# Patient Record
Sex: Female | Born: 1990 | Race: White | Hispanic: No | Marital: Single | Smoking: Never smoker
Health system: Southern US, Community
[De-identification: ages and names within clinical notes are randomized; demographics above are authoritative.]

## PROBLEM LIST (undated history)

## (undated) DIAGNOSIS — E162 Hypoglycemia, unspecified: Secondary | ICD-10-CM

## (undated) HISTORY — DX: Hypoglycemia, unspecified: E16.2

---
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
## 2013-08-20 ENCOUNTER — Telehealth: Payer: Self-pay | Admitting: Family Medicine

## 2013-08-20 NOTE — Telephone Encounter (Signed)
Patient called and wanted to schedule an appointment to have her shots done. Patient states that she needs the flu shot/ tb/ mmr/ chicken pox /tetanus/hep B for her employment. Please advise.

## 2013-08-20 NOTE — Telephone Encounter (Signed)
Patient has not been seen in the office since 2012. Pt needs OV.

## 2013-08-21 ENCOUNTER — Encounter (INDEPENDENT_AMBULATORY_CARE_PROVIDER_SITE_OTHER): Payer: Self-pay

## 2013-08-21 ENCOUNTER — Ambulatory Visit (INDEPENDENT_AMBULATORY_CARE_PROVIDER_SITE_OTHER): Payer: 59 | Admitting: Family Medicine

## 2013-08-21 ENCOUNTER — Encounter: Payer: Self-pay | Admitting: Family Medicine

## 2013-08-21 VITALS — BP 120/78 | HR 94 | Temp 98.4°F | Resp 16 | Ht 64.5 in | Wt 223.5 lb

## 2013-08-21 DIAGNOSIS — Z0184 Encounter for antibody response examination: Secondary | ICD-10-CM

## 2013-08-21 DIAGNOSIS — Z23 Encounter for immunization: Secondary | ICD-10-CM

## 2013-08-21 DIAGNOSIS — Z0000 Encounter for general adult medical examination without abnormal findings: Secondary | ICD-10-CM

## 2013-08-21 DIAGNOSIS — Z111 Encounter for screening for respiratory tuberculosis: Secondary | ICD-10-CM

## 2013-08-21 NOTE — Patient Instructions (Signed)
Come back on Friday to have your TB test read Follow up in 1 year or as needed We'll notify you of your lab results and make any changes if needed Call with any questions or concerns GOOD LUCK!

## 2013-08-21 NOTE — Progress Notes (Signed)
Pre visit review using our clinic review tool, if applicable. No additional management support is needed unless otherwise documented below in the visit note. 

## 2013-08-21 NOTE — Assessment & Plan Note (Signed)
Pt's PE WNL w/ exception of obesity.  Check labs.  Anticipatory guidance provided.  

## 2013-08-21 NOTE — Telephone Encounter (Signed)
Patient's mother called and stated that patient received immunizations until the age of 89. The office where she received these immunizations misplaced her records. Appointment is scheduled for 08/21/2013. JG//CMA

## 2013-08-21 NOTE — Progress Notes (Signed)
   Subjective:    Patient ID: Meredith [name], female    DOB: 04-Jan-1991, 23 y.o.   MRN: [id number]  HPI CPE- no concerns today.   Review of Systems Patient reports no vision/ hearing changes, adenopathy,fever, weight change,  persistant/recurrent hoarseness , swallowing issues, chest pain, palpitations, edema, persistant/recurrent cough, hemoptysis, dyspnea (rest/exertional/paroxysmal nocturnal), gastrointestinal bleeding (melena, rectal bleeding), abdominal pain, significant heartburn, bowel changes, GU symptoms (dysuria, hematuria, incontinence), Gyn symptoms (abnormal  bleeding, pain),  syncope, focal weakness, memory loss, numbness & tingling, skin/hair/nail changes, abnormal bruising or bleeding, anxiety, or depression.     Objective:   Physical Exam General Appearance:    Alert, cooperative, no distress, appears stated age  Head:    Normocephalic, without obvious abnormality, atraumatic  Eyes:    PERRL, conjunctiva/corneas clear, EOM's intact, fundi    benign, both eyes  Ears:    Normal TM's and external ear canals, both ears  Nose:   Nares normal, septum midline, mucosa normal, no drainage    or sinus tenderness  Throat:   Lips, mucosa, and tongue normal; teeth and gums normal  Neck:   Supple, symmetrical, trachea midline, no adenopathy;    Thyroid: no enlargement/tenderness/nodules  Back:     Symmetric, no curvature, ROM normal, no CVA tenderness  Lungs:     Clear to auscultation bilaterally, respirations unlabored  Chest Wall:    No tenderness or deformity   Heart:    Regular rate and rhythm, S1 and S2 normal, no murmur, rub   or gallop  Breast Exam:    Deferred at pt's request  Abdomen:     Soft, non-tender, bowel sounds active all four quadrants,    no masses, no organomegaly  Genitalia:    Deferred  Rectal:    Extremities:   Extremities normal, atraumatic, no cyanosis or edema  Pulses:   2+ and symmetric all extremities  Skin:   Skin color, texture, turgor normal, no  rashes or lesions  Lymph nodes:   Cervical, supraclavicular, and axillary nodes normal  Neurologic:   CNII-XII intact, normal strength, sensation and reflexes    throughout          Assessment & Plan:

## 2013-08-22 ENCOUNTER — Encounter: Payer: Self-pay | Admitting: General Practice

## 2013-08-22 LAB — CBC WITH DIFFERENTIAL/PLATELET
BASOS ABS: 0 10*3/uL (ref 0.0–0.1)
Basophils Relative: 0.3 % (ref 0.0–3.0)
Eosinophils Absolute: 0.4 10*3/uL (ref 0.0–0.7)
Eosinophils Relative: 3.9 % (ref 0.0–5.0)
HEMATOCRIT: 41.1 % (ref 36.0–46.0)
Hemoglobin: 13.8 g/dL (ref 12.0–15.0)
LYMPHS PCT: 34.2 % (ref 12.0–46.0)
Lymphs Abs: 3.3 10*3/uL (ref 0.7–4.0)
MCHC: 33.6 g/dL (ref 30.0–36.0)
MCV: 88.5 fl (ref 78.0–100.0)
MONOS PCT: 4.4 % (ref 3.0–12.0)
Monocytes Absolute: 0.4 10*3/uL (ref 0.1–1.0)
Neutro Abs: 5.5 10*3/uL (ref 1.4–7.7)
Neutrophils Relative %: 57.2 % (ref 43.0–77.0)
PLATELETS: 234 10*3/uL (ref 150.0–400.0)
RBC: 4.65 Mil/uL (ref 3.87–5.11)
RDW: 14 % (ref 11.5–14.6)
WBC: 9.6 10*3/uL (ref 4.5–10.5)

## 2013-08-22 LAB — HEPATIC FUNCTION PANEL
ALBUMIN: 4.4 g/dL (ref 3.5–5.2)
ALT: 19 U/L (ref 0–35)
AST: 18 U/L (ref 0–37)
Alkaline Phosphatase: 43 U/L (ref 39–117)
Bilirubin, Direct: 0 mg/dL (ref 0.0–0.3)
TOTAL PROTEIN: 7.8 g/dL (ref 6.0–8.3)
Total Bilirubin: 0.5 mg/dL (ref 0.3–1.2)

## 2013-08-22 LAB — BASIC METABOLIC PANEL
BUN: 14 mg/dL (ref 6–23)
CO2: 24 meq/L (ref 19–32)
Calcium: 9 mg/dL (ref 8.4–10.5)
Chloride: 106 mEq/L (ref 96–112)
Creatinine, Ser: 0.5 mg/dL (ref 0.4–1.2)
GFR: 180.43 mL/min (ref >60.00)
GLUCOSE: 74 mg/dL (ref 70–99)
POTASSIUM: 3.7 meq/L (ref 3.5–5.1)
SODIUM: 140 meq/L (ref 135–145)

## 2013-08-22 LAB — LIPID PANEL
Cholesterol: 195 mg/dL (ref 0–200)
HDL: 45.8 mg/dL (ref >39.00)
LDL Cholesterol: 128 mg/dL — ABNORMAL HIGH (ref 0–99)
Total CHOL/HDL Ratio: 4
Triglycerides: 104 mg/dL (ref 0.0–149.0)
VLDL: 20.8 mg/dL (ref 0.0–40.0)

## 2013-08-22 LAB — TSH: TSH: 0.68 u[IU]/mL (ref 0.35–5.50)

## 2013-08-22 LAB — VARICELLA ZOSTER ANTIBODY, IGG: VARICELLA IGG: 771.9 {index} — AB (ref <135.00)

## 2013-08-23 LAB — TB SKIN TEST
Induration: 0 mm
TB SKIN TEST: NEGATIVE

## 2013-08-26 ENCOUNTER — Encounter: Payer: Self-pay | Admitting: General Practice

## 2013-08-26 LAB — VITAMIN D 1,25 DIHYDROXY
VITAMIN D 1, 25 (OH) TOTAL: 96 pg/mL — AB (ref 18–72)
VITAMIN D3 1, 25 (OH): 96 pg/mL

## 2014-03-21 ENCOUNTER — Ambulatory Visit (INDEPENDENT_AMBULATORY_CARE_PROVIDER_SITE_OTHER): Payer: 59

## 2014-03-21 DIAGNOSIS — Z23 Encounter for immunization: Secondary | ICD-10-CM

## 2014-05-14 ENCOUNTER — Ambulatory Visit: Payer: 59 | Admitting: Physician Assistant

## 2015-03-16 ENCOUNTER — Telehealth: Payer: Self-pay | Admitting: Family Medicine

## 2015-03-16 NOTE — Telephone Encounter (Signed)
Relation to ZO:XWRU Call back [phone]   Reason for call:  Patient would like her TB done at the same time as her flu shot 03/17/15

## 2015-03-17 ENCOUNTER — Ambulatory Visit (INDEPENDENT_AMBULATORY_CARE_PROVIDER_SITE_OTHER): Payer: 59

## 2015-03-17 DIAGNOSIS — Z23 Encounter for immunization: Secondary | ICD-10-CM

## 2015-03-17 NOTE — Telephone Encounter (Signed)
Ok to have ppd

## 2015-03-17 NOTE — Addendum Note (Signed)
Addended by: Lurline Hare on: 03/17/2015 03:28 PM   Modules accepted: Orders

## 2015-03-19 LAB — TB SKIN TEST
Induration: 0 mm
TB Skin Test: NEGATIVE

## 2015-11-02 ENCOUNTER — Ambulatory Visit (INDEPENDENT_AMBULATORY_CARE_PROVIDER_SITE_OTHER): Payer: 59 | Admitting: Physician Assistant

## 2015-11-02 ENCOUNTER — Encounter: Payer: Self-pay | Admitting: Physician Assistant

## 2015-11-02 VITALS — BP 120/78 | HR 93 | Temp 98.2°F | Ht 64.5 in | Wt 262.8 lb

## 2015-11-02 DIAGNOSIS — J029 Acute pharyngitis, unspecified: Secondary | ICD-10-CM | POA: Diagnosis not present

## 2015-11-02 DIAGNOSIS — H6692 Otitis media, unspecified, left ear: Secondary | ICD-10-CM

## 2015-11-02 LAB — POCT RAPID STREP A (OFFICE): RAPID STREP A SCREEN: NEGATIVE

## 2015-11-02 MED ORDER — AZITHROMYCIN 250 MG PO TABS: ORAL_TABLET | ORAL | Status: DC

## 2015-11-02 NOTE — Progress Notes (Signed)
Pre visit review using our clinic review tool, if applicable. No additional management support is needed unless otherwise documented below in the visit note. 

## 2015-11-02 NOTE — Progress Notes (Signed)
   Patient presents to clinic today c/o sore throat starting on Thursday of last week. Endorses nasal congestion, sinus pressure and sinus pain. Endorses dry cough starting today. Endorses intermittent fevers but none today. Denies recent travel or sick contact. Has taken Nyquil and Ibuprofen for symptom relief.   Past Medical History  Diagnosis Date  . Hypoglycemia     No current outpatient prescriptions on file prior to visit.   No current facility-administered medications on file prior to visit.    Allergies  Allergen Reactions  . Penicillins     Family History  Problem Relation Age of Onset  . Hyperlipidemia Father     Social History   Social History  . Marital Status: Married    Spouse Name: N/A  . Number of Children: N/A  . Years of Education: N/A   Social History Main Topics  . Smoking status: Never Smoker   . Smokeless tobacco: None  . Alcohol Use: No  . Drug Use: No  . Sexual Activity: Not Asked   Other Topics Concern  . None   Social History Narrative   Review of Systems - See HPI.  All other ROS are negative.  BP 120/78 mmHg  Pulse 93  Temp(Src) 98.2 F (36.8 C) (Oral)  Ht 5' 4.5" (1.638 m)  Wt 262 lb 12.8 oz (119.205 kg)  BMI 44.43 kg/m2  SpO2 99%  LMP 10/23/2015  Physical Exam  Constitutional: She is oriented to person, place, and time and well-developed, well-nourished, and in no distress.  HENT:  Head: Normocephalic and atraumatic.  Right Ear: Tympanic membrane normal.  Left Ear: Tympanic membrane is erythematous and bulging. A middle ear effusion is present.  Nose: Nose normal.  Mouth/Throat: Uvula is midline and mucous membranes are normal.  Tonsils 2+. No exudate present.   Eyes: Conjunctivae are normal.  Neck: Neck supple.  Cardiovascular: Normal rate, regular rhythm, normal heart sounds and intact distal pulses.   Pulmonary/Chest: Effort normal and breath sounds normal. No respiratory distress. She has no wheezes. She has no  rales. She exhibits no tenderness.  Neurological: She is alert and oriented to person, place, and time.  Skin: Skin is warm and dry. No rash noted.  Psychiatric: Affect normal.   Assessment/Plan: 1. Sore throat Rapid strep negative. Exam without signs of strep throat. Feel symptoms stemming from PND and related to developing sinusitis. Will begin ABX today. Supportive measures reviewed. - POCT rapid strep A  2. Acute left otitis media, recurrence not specified, unspecified otitis media type Penicillin-allergic. Will begin Z-pack. Supportive measures reviewed. FU PRN. - azithromycin (ZITHROMAX) 250 MG tablet; Take 2 tablets on Day 1. Then take 1 tablet daily.  Dispense: 6 tablet; Refill: 0

## 2015-11-02 NOTE — Patient Instructions (Signed)
Please take antibiotic as directed. Increase fluids intake.  Alternate tylenol and ibuprofen for ear pain and throat pain. Salt-water gargles will be beneficial. Saline nasal spray will help with nasal congestion and post-nasal drip.  Follow-up if symptoms are not resolving.

## 2016-02-25 ENCOUNTER — Telehealth: Payer: Self-pay | Admitting: Family Medicine

## 2016-02-25 NOTE — Telephone Encounter (Signed)
Will wait until management input to inform pt. Pt is scheduled on [facility]'s schedule for Monday as well.

## 2016-02-25 NOTE — Telephone Encounter (Signed)
Pt states that she called the [place] location for an appt for shoulder pain thinking that she may have to have an Xray and wanting to have all of this done in one place and was told they could not schedule her there b/c she was a [facility] Pt unless she changed her pcp and then trans her to me. Pt thought that want strange due to seeing Selena BattenCody back in May for an acute visit. Pt does not remember who she spoke with.

## 2016-02-25 NOTE — Telephone Encounter (Signed)
I believe it is [facility] policy to see acute visits at the location most convenient for the pt.  I understand that pt would like appt and possible imaging in same location so I will forward this to management for review.  I would be happy to see pt and help w/ her issue as imaging may not be required but I understand her desire for convenience

## 2016-02-26 NOTE — Telephone Encounter (Signed)
Patient called [place] location today scheduled with Dr. Patsy Lageropland for Monday 02/29/16 at 1:45pm.

## 2016-02-29 ENCOUNTER — Encounter: Payer: Self-pay | Admitting: Family Medicine

## 2016-02-29 ENCOUNTER — Ambulatory Visit (INDEPENDENT_AMBULATORY_CARE_PROVIDER_SITE_OTHER): Payer: 59 | Admitting: Family Medicine

## 2016-02-29 ENCOUNTER — Ambulatory Visit: Payer: 59 | Admitting: Family Medicine

## 2016-02-29 VITALS — BP 138/82 | HR 100 | Temp 98.5°F | Ht 64.5 in | Wt 250.2 lb

## 2016-02-29 DIAGNOSIS — M25512 Pain in left shoulder: Secondary | ICD-10-CM | POA: Diagnosis not present

## 2016-02-29 DIAGNOSIS — Z23 Encounter for immunization: Secondary | ICD-10-CM

## 2016-02-29 MED ORDER — CYCLOBENZAPRINE HCL 10 MG PO TABS: 10.0000 mg | ORAL_TABLET | Freq: Two times a day (BID) | ORAL | 0 refills | Status: DC | PRN

## 2016-02-29 NOTE — Progress Notes (Signed)
[facility] [address] [place], [place] [address] [phone]  Date:  02/29/2016   Name:  Meredith [name]   DOB:  1990/12/11   MRN:  [id number]  PCP:  Neena Rhymes, MD    Chief Complaint: Shoulder Pain (c/o left shoulder pain. Pt was working at [employer] and noticed that after working 4 hours her shoulder would be sore and has worsen. Keeping pt up at night. )   History of Present Illness:  Meredith [name] is a 25 y.o. very pleasant female patient who presents with the following:  She first started noting pain in the left shoulder in July. She notes it would start after she worked 4-5 hours at her part time job at [employer]; she tried to keep her shifts under 6 hours.  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  She is right handed She strarted the job at [employer] in April and quit 2 weeks ago The shoulder is more painful at night She is taking ibuprofen which does help her some- it does not make the pain go away but it does improve her pain so she can sleep  She has not had this issue in the past She otherwise feels well  Patient Active Problem List   Diagnosis Date Noted  . Fever 12/22/2010  . General medical examination 12/22/2010  . LOM 01/26/2010    Past Medical History:  Diagnosis Date  . Hypoglycemia     No past surgical history on file.  Social History  Substance Use Topics  . Smoking status: Never Smoker  . Smokeless tobacco: Not on file  . Alcohol use No    Family History  Problem Relation Age of Onset  . Hyperlipidemia Father     Allergies  Allergen Reactions  . Penicillins     Medication list has been reviewed and updated.  No current outpatient  prescriptions on file prior to visit.   No current facility-administered medications on file prior to visit.     Review of Systems:  As per HPI- otherwise negative. LMP was at the beginning of this month   Physical Examination: Vitals:   02/29/16 1415  BP: 138/82  Pulse: 100  Temp: 98.5 F (36.9 C)   Vitals:   02/29/16 1415  Weight: 250 lb 3.2 oz (113.5 kg)  Height: 5' 4.5" (1.638 m)   Body mass index is 42.28 kg/m. Ideal Body Weight: Weight in (lb) to have BMI = 25: 147.6  GEN: WDWN, NAD, Non-toxic, A & O x 3, obese, looks well HEENT: Atraumatic, Normocephalic. Neck supple. No masses, No LAD. Ears and Nose: No external deformity. CV: RRR, No M/G/R. No JVD. No thrill. No extra heart sounds. PULM: CTA B, no wheezes, crackles, rhonchi. No retractions. No resp. distress. No accessory muscle use. EXTR: No c/c/e NEURO Normal gait.  PSYCH: Normally interactive. Conversant. Not depressed or anxious appearing.  Calm demeanor.  Left shoulder:  She has full active ROM. Mild tenderness with supraspinatous testing and Hawkins testing Normal internal and external rotation   Assessment and Plan: Left shoulder pain - Plan: Ambulatory referral to Physical Therapy, cyclobenzaprine (FLEXERIL)  10 MG tablet  Immunization due  Flu shot today Left rotator cuff tendonitis- will refer for PT, flexeril as needed She will let me know if not improved in the next few weeks- Sooner if worse.    Signed Abbe AmsterdamJessica Copland, MD

## 2016-02-29 NOTE — Patient Instructions (Addendum)
It was nice to see you today- we will get you into PT for your shoulder pain .  I think you have rotator cuff tendonitis but not a tear.  Continue to use ibuprofen or aleve as needed, and I will give you some flexeril muscle relaxer as well.  This can make you sleepy so you will probably only want to use it at night, you can use a 1/2 tablet if a whole is too strong.  Let me know if your foot does not start getting better with some stretching, or if your shoulder does not improve!

## 2016-03-09 ENCOUNTER — Ambulatory Visit: Payer: 59 | Attending: Family Medicine | Admitting: Physical Therapy

## 2016-03-09 DIAGNOSIS — M25512 Pain in left shoulder: Secondary | ICD-10-CM | POA: Insufficient documentation

## 2016-03-09 DIAGNOSIS — R293 Abnormal posture: Secondary | ICD-10-CM | POA: Insufficient documentation

## 2016-03-09 DIAGNOSIS — M6281 Muscle weakness (generalized): Secondary | ICD-10-CM | POA: Insufficient documentation

## 2016-03-09 NOTE — Patient Instructions (Signed)
CHEST: Doorway, Bilateral - Standing    Standing in doorway, place hands on wall with elbows bent at shoulder height. Lean forward. Hold _20-30__ seconds. _2-3__ reps per set, _2-3__ sets per day, _6-7__ days per week  Copyright  VHI. All rights reserved.   Scapular Retraction: Rowing (Eccentric) - Arms - Side (Resistance Band)    Hold end of band in each hand. Pull back until elbows are even with trunk. Keep elbows by sides, thumbs up. Slowly release for 3-5 seconds. Use __green____ resistance band. _15__ reps per set, _2-3__ sets per day, _6-7__ days per week.   http://ecce.exer.us/227   Copyright  VHI. All rights reserved.

## 2016-03-09 NOTE — Therapy (Signed)
[facility] [address]  [address] [place], [place], [address] Phone: [phone]   Fax:  [phone]  Physical Therapy Evaluation  Patient Details  Name: Meredith [name] MRN: [id number] Date of Birth: 29-Apr-1991 Referring Provider: Abbe Amsterdam, MD  Encounter Date: 03/09/2016      PT End of Session - 03/09/16 1612    Visit Number 1   Number of Visits 12   Date for PT Re-Evaluation 04/20/16  anticipate d/c in 4 weeks   PT Start Time 1532   PT Stop Time 1612   PT Time Calculation (min) 40 min   Activity Tolerance Patient tolerated treatment well   Behavior During Therapy [facility] for tasks assessed/performed      Past Medical History:  Diagnosis Date  . Hypoglycemia     No past surgical history on file.  There were no vitals filed for this visit.       Subjective Assessment - 03/09/16 1534    Subjective Pt is a 25 y/o female who presents to OPPT with L shoulder pain.  Pt reports she began working at [employer] in April, and beginning in July started to develop L shoulder/arm pain when working > 4-5 hours.  Pt states pain got progressively worse, and resigned from [employer] in September.  Pt reports pain has improved since she's stopped working at [employer] but continues to have pain with lifting heavier objects.   Limitations Lifting;Sitting   How long can you sit comfortably? needs shoulder support   Diagnostic tests x rays negative   Patient Stated Goals improve pain, strengthen arm   Currently in Pain? Yes   Pain Score 0-No pain  up to 6-7/10   Pain Location Shoulder   Pain Orientation Left   Pain Descriptors / Indicators Sore  "pulling"   Pain Type Acute pain   Pain Onset More than a month ago   Pain Frequency Intermittent   Aggravating Factors  carrying items   Pain Relieving Factors nothing, ibuprofen            [facility] PT Assessment - 03/09/16 1541      Assessment   Medical Diagnosis L shoulder pain;  RTC tendonitis   Referring Provider Abbe Amsterdam, MD   Onset Date/Surgical Date --  July 2016   Hand Dominance Right   Next MD Visit PRN   Prior Therapy none     Precautions   Precautions None     Restrictions   Weight Bearing Restrictions No     Balance Screen   Has the patient fallen in the past 6 months No   Has the patient had a decrease in activity level because of a fear of falling?  No   Is the patient reluctant to leave their home because of a fear of falling?  No     Prior Function   Level of Independence Independent   Vocation Part time employment   Vocation Requirements ASL interpreter   Leisure watch movies, TV, walk dog     Cognition   Overall Cognitive Status Within Functional Limits for tasks assessed     Observation/Other Assessments   Focus on Therapeutic Outcomes (FOTO)  55 (45% limited; predicted 30% limited)     Posture/Postural Control   Posture/Postural Control Postural limitations   Postural Limitations Rounded Shoulders;Forward head     AROM   Overall AROM Comments pain with flexion and abdct   AROM Assessment Site Shoulder   Right Shoulder Flexion  166 Degrees   Right Shoulder ABduction 178 Degrees   Right Shoulder Internal Rotation 84 Degrees   Right Shoulder External Rotation 90 Degrees   Left Shoulder Flexion 155 Degrees   Left Shoulder ABduction 170 Degrees   Left Shoulder Internal Rotation 85 Degrees   Left Shoulder External Rotation 77 Degrees     Strength   Overall Strength Comments pain with flexion/abdct   Strength Assessment Site Shoulder   Right Shoulder Flexion 4/5   Right Shoulder ABduction 4/5   Right Shoulder Internal Rotation 5/5   Right Shoulder External Rotation 4/5   Left Shoulder Flexion 3/5   Left Shoulder ABduction 3/5   Left Shoulder Internal Rotation 4/5   Left Shoulder External Rotation 3/5     Special Tests    Special Tests Rotator Cuff Impingement   Rotator Cuff Impingment tests Leanord Asal  test;Empty Can test;Full Can test     Hawkins-Kennedy test   Findings Positive   Side Left     Empty Can test   Findings Positive   Side Left   Comment min increase in pain     Full Can test   Findings Positive   Side Left   Comment min increase in pain                   [facility] Adult PT Treatment/Exercise - 03/09/16 1541      Exercises   Exercises Shoulder     Shoulder Exercises: Standing   Retraction Both;10 reps;Theraband   Theraband Level (Shoulder Retraction) Level 3 (Green)     Shoulder Exercises: Lawyer 30 seconds;2 reps   Corner Stretch Limitations arms down and 90 degrees                PT Education - 03/09/16 1612    Education provided Yes   Education Details HEP, clinical findings, POC   Person(s) Educated Patient   Methods Explanation;Demonstration;Handout   Comprehension Verbalized understanding;Returned demonstration             PT Long Term Goals - 03/09/16 1615      PT LONG TERM GOAL #1   Title independent with HEP (04/20/16)   Time 6   Period Weeks   Status New     PT LONG TERM GOAL #2   Title perform L shoulder ROM without pain (04/20/16)   Time 6   Period Weeks   Status New     PT LONG TERM GOAL #3   Title tolerate carrying up to 8# without pain for improved function (04/20/16)   Time 6   Period Weeks   Status New     PT LONG TERM GOAL #4   Title improve L shoulder strength to 4/5 for improved function (04/20/16)   Time 6   Period Weeks   Status New               Plan - 03/09/16 1613    Clinical Impression Statement Pt is a 25 y/o female who presents to OPPT for L shoulder pain.  Clinical findings consistent with impingement syndrome.  Pt demonstrates postural abnormalities, decreased strength, mildly decreased ROM and pain affecting function.  Will benefit from PT to address these deficits.   Rehab Potential Good   PT Frequency 2x / week   PT Duration 6 weeks  anticipate d/c 4  weeks   PT Treatment/Interventions ADLs/Self Care Home Management;Cryotherapy;Electrical Stimulation;Iontophoresis 4mg /ml Dexamethasone;Moist Heat;Ultrasound;Therapeutic exercise;Therapeutic activities;Patient/family education;Manual techniques;Passive range of motion;Vasopneumatic  Device;Taping;Dry needling   PT Next Visit Plan review HEP; scap stab strengthening, RTC strengthening, modalities PRN for pain   Consulted and Agree with Plan of Care Patient      Patient will benefit from skilled therapeutic intervention in order to improve the following deficits and impairments:  Decreased range of motion, Postural dysfunction, Pain, Decreased strength  Visit Diagnosis: Pain in left shoulder - Plan: PT plan of care cert/re-cert  Abnormal posture - Plan: PT plan of care cert/re-cert  Muscle weakness (generalized) - Plan: PT plan of care cert/re-cert     Problem List Patient Active Problem List   Diagnosis Date Noted  . Fever 12/22/2010  . General medical examination 12/22/2010  . LOM 01/26/2010       Clarita CraneStephanie F Alaynna Kerwood, PT, DPT 03/09/16 4:19 PM    [facility] [place] [address]  [address] [place], [place], [address] Phone: [phone]   Fax:  [phone]  Name: Verner CholRachel K Clabo MRN: [id number] Date of Birth: 1991/02/01

## 2016-03-11 ENCOUNTER — Telehealth: Payer: Self-pay | Admitting: *Deleted

## 2016-03-11 ENCOUNTER — Ambulatory Visit (INDEPENDENT_AMBULATORY_CARE_PROVIDER_SITE_OTHER): Payer: 59 | Admitting: Emergency Medicine

## 2016-03-11 DIAGNOSIS — Z111 Encounter for screening for respiratory tuberculosis: Secondary | ICD-10-CM | POA: Diagnosis not present

## 2016-03-11 NOTE — Telephone Encounter (Signed)
-----   Message from Waldon MerlWilliam C Martin, PA-C sent at 03/11/2016 11:55 AM EDT ----- Regarding: RE: TB Test Ok to place the PPD or order the TB gold assay if needed ----- Message ----- From: Starla Linkarolyn J Tameko Halder, RN Sent: 03/11/2016  11:49 AM To: Waldon MerlWilliam C Martin, PA-C, Sheliah HatchKatherine E Tabori, MD Subject: FW: TB Test                                    Cody, please verify okay as we discussed earlier since Dr. Beverely Lowabori is off today. Thanks!  ----- Message ----- From: Starla Linkarolyn J Cambree Hendrix, RN Sent: 03/11/2016  10:43 AM To: Sheliah HatchKatherine E Tabori, MD Subject: TB Test                                        [facility] TB test [facility]. Unclear if TB skin test or Quantiferon as there is no documentation in chart. Please advise if ok to place PPD, or to order Quantiferon if pt prefers.   Thanks!

## 2016-03-11 NOTE — Progress Notes (Signed)
Pre visit review using our clinic review tool, if applicable. No additional management support is needed unless otherwise documented below in the visit note.  Per 9/29 telephone note: Ok to place the PPD or order the TB gold assay if needed.  PPD Placement note Meredith [name], 25 y.o. female is here today for placement of PPD test Reason for PPD test: Required by employer Pt taken PPD test before: yes Verified in allergy area and with patient that they are not allergic to the products PPD is made of (Phenol or Tween). Yes  O: Alert and oriented in NAD. P:  PPD placed on 03/11/2016.  Patient advised to return for reading within 48-72 hours.  Cammy Copaanesha N Chandler, CMA

## 2016-03-14 LAB — TB SKIN TEST
INDURATION: 0 mm
TB Skin Test: NEGATIVE

## 2016-03-14 NOTE — Progress Notes (Signed)
Called pt and lmovm to return call.

## 2016-03-15 ENCOUNTER — Encounter: Payer: Self-pay | Admitting: General Practice

## 2016-03-17 ENCOUNTER — Ambulatory Visit: Payer: 59 | Attending: Family Medicine | Admitting: Physical Therapy

## 2016-03-17 DIAGNOSIS — G8929 Other chronic pain: Secondary | ICD-10-CM

## 2016-03-17 DIAGNOSIS — M25512 Pain in left shoulder: Secondary | ICD-10-CM | POA: Insufficient documentation

## 2016-03-17 DIAGNOSIS — M6281 Muscle weakness (generalized): Secondary | ICD-10-CM | POA: Insufficient documentation

## 2016-03-17 DIAGNOSIS — R293 Abnormal posture: Secondary | ICD-10-CM | POA: Diagnosis present

## 2016-03-17 NOTE — Therapy (Signed)
[facility] [address]  [address] [place], [place], [address] Phone: [phone]   Fax:  [phone]  Physical Therapy Treatment  Patient Details  Name: Meredith [name] MRN: [id number] Date of Birth: [birth year] Referring Provider: Abbe Amsterdam, MD  Encounter Date: 03/17/2016      PT End of Session - 03/17/16 1611    Visit Number 2   Number of Visits 12   Date for PT Re-Evaluation 04/20/16   PT Start Time 1533   PT Stop Time 1611   PT Time Calculation (min) 38 min   Activity Tolerance Patient tolerated treatment well   Behavior During Therapy [facility] for tasks assessed/performed      Past Medical History:  Diagnosis Date  . Hypoglycemia     No past surgical history on file.  There were no vitals filed for this visit.      Subjective Assessment - 03/17/16 1535    Subjective Had pain last night in shoulder, but wasn't having much pain up until yesterday.  Reports shoulder feels tight.   Limitations Lifting;Sitting   How long can you sit comfortably? needs shoulder support   Diagnostic tests x rays negative   Patient Stated Goals improve pain, strengthen arm   Currently in Pain? Yes   Pain Score 5    Pain Location Shoulder   Pain Orientation Left   Pain Descriptors / Indicators Tightness;Sore   Pain Type Acute pain   Pain Onset More than a month ago   Pain Frequency Intermittent   Aggravating Factors  carrying items   Pain Relieving Factors nothing, ibuprofen                         [facility] Adult PT Treatment/Exercise - 03/17/16 1537      Shoulder Exercises: Supine   Protraction Left;15 reps;Weights   Protraction Weight (lbs) 3   Horizontal ABduction Both;15 reps;Theraband   Theraband Level (Shoulder Horizontal ABduction) Level 1 (Yellow)   Horizontal ABduction Limitations on foam roll   External Rotation Both;15 reps;Theraband   Theraband Level (Shoulder External Rotation) Level 1  (Yellow)   External Rotation Limitations on foam roll   Flexion Left;15 reps;Weights   Shoulder Flexion Weight (lbs) 1     Shoulder Exercises: Standing   External Rotation Left;15 reps;Theraband   Theraband Level (Shoulder External Rotation) Level 1 (Yellow)   Internal Rotation Left;15 reps;Theraband   Theraband Level (Shoulder Internal Rotation) Level 1 (Yellow)   Retraction Both;10 reps;Theraband   Theraband Level (Shoulder Retraction) Level 3 (Green)     Shoulder Exercises: Therapy Ball   Flexion 5 reps   Flexion Limitations 20 sec each     Shoulder Exercises: ROM/Strengthening   UBE (Upper Arm Bike) L 2.5 x 6 min (3' fwd/3' bwd)     Shoulder Exercises: Stretch   Corner Stretch 30 seconds;2 reps   Corner Stretch Limitations arms down and 90 degrees   Other Shoulder Stretches on foam roll chest stretch x 2-3 min                PT Education - 03/17/16 1611    Education provided Yes   Education Details added ER/IR to [employer]) Educated Patient   Methods Explanation;Demonstration;Handout   Comprehension Verbalized understanding;Returned demonstration             PT Long Term Goals - 03/17/16 1612      PT LONG TERM GOAL #1  Title independent with HEP (04/20/16)   Status On-going     PT LONG TERM GOAL #2   Title perform L shoulder ROM without pain (04/20/16)   Status On-going     PT LONG TERM GOAL #3   Title tolerate carrying up to 8# without pain for improved function (04/20/16)   Status On-going     PT LONG TERM GOAL #4   Title improve L shoulder strength to 4/5 for improved function (04/20/16)   Status On-going               Plan - 03/17/16 1612    Clinical Impression Statement Pt demonstrated correct technique with initial HEP and able to add 2 additional exercises today.  Mild increase in pain with standing theraband but no pain after supine exercises and no pain after session.  Will continue to benefit from PT to maximize function.    PT Treatment/Interventions ADLs/Self Care Home Management;Cryotherapy;Electrical Stimulation;Iontophoresis 4mg /ml Dexamethasone;Moist Heat;Ultrasound;Therapeutic exercise;Therapeutic activities;Patient/family education;Manual techniques;Passive range of motion;Vasopneumatic Device;Taping;Dry needling   PT Next Visit Plan review HEP; scap stab strengthening, RTC strengthening, modalities PRN for pain   Consulted and Agree with Plan of Care Patient      Patient will benefit from skilled therapeutic intervention in order to improve the following deficits and impairments:  Decreased range of motion, Postural dysfunction, Pain, Decreased strength  Visit Diagnosis: Chronic left shoulder pain  Abnormal posture  Muscle weakness (generalized)     Problem List Patient Active Problem List   Diagnosis Date Noted  . Fever 12/22/2010  . General medical examination 12/22/2010  . LOM 01/26/2010      Clarita CraneStephanie F Kimiye Strathman, PT, DPT 03/17/16 4:14 PM   [facility] [place] [address]  [address] [place], [place], [address] Phone: [phone]   Fax:  [phone]  Name: Verner CholRachel K Winograd MRN: [id number] Date of Birth: [birth year]

## 2016-03-17 NOTE — Patient Instructions (Signed)
Strengthening: Resisted Internal Rotation    Hold tubing in left hand, elbow at side and forearm out. Rotate forearm in across body. Repeat __15__ times per set. Do _1___ sets per session. Do _2-3___ sessions per day.  http://orth.exer.us/831   Copyright  VHI. All rights reserved.   Strengthening: Resisted External Rotation    Hold tubing in left hand, elbow at side and forearm across body. Rotate forearm out. Repeat _15___ times per set. Do __1__ sets per session. Do _2-3___ sessions per day.  http://orth.exer.us/829   Copyright  VHI. All rights reserved.

## 2016-03-22 ENCOUNTER — Ambulatory Visit: Payer: 59

## 2016-03-22 DIAGNOSIS — M25512 Pain in left shoulder: Principal | ICD-10-CM

## 2016-03-22 DIAGNOSIS — R293 Abnormal posture: Secondary | ICD-10-CM

## 2016-03-22 DIAGNOSIS — G8929 Other chronic pain: Secondary | ICD-10-CM

## 2016-03-22 DIAGNOSIS — M6281 Muscle weakness (generalized): Secondary | ICD-10-CM

## 2016-03-22 NOTE — Therapy (Signed)
[facility] [place] [address]  [address] [place], [place], [address] Phone: [phone]   Fax:  [phone]  Physical Therapy Treatment  Patient Details  Name: Verner CholRachel K Mentink MRN: [id number] Date of Birth: 08/13/1990 Referring Provider: Abbe AmsterdamJessica Copland, MD  Encounter Date: 03/22/2016      PT End of Session - 03/22/16 1600    Visit Number 3   Number of Visits 12   Date for PT Re-Evaluation 04/20/16   PT Start Time 1539   PT Stop Time 1619   PT Time Calculation (min) 40 min   Activity Tolerance Patient tolerated treatment well   Behavior During Therapy [facility] for tasks assessed/performed      Past Medical History:  Diagnosis Date  . Hypoglycemia     No past surgical history on file.  There were no vitals filed for this visit.      Subjective Assessment - 03/22/16 1543    Subjective Pt. reporting her L shoulder pain was worse at a 8/10 last night while laying in bed with no known trigger.   Patient Stated Goals improve pain, strengthen arm   Currently in Pain? Yes   Pain Score 2    Pain Location Shoulder   Pain Orientation Left   Pain Descriptors / Indicators Tightness;Sore   Pain Type Acute pain   Pain Onset More than a month ago   Pain Frequency Intermittent   Multiple Pain Sites No      Today's treatment:  Therex (pt. reporting pain throughout therex sporadically which resolved quickly): UBE: lvl 1.5, 3 min forward/backwards each way Standing IR, ER with yellow TB x 15 reps  Standing scapular retraction with green TB x 10 reps BATCA low row 20# x 15 reps Laying on 1/2 foam bolster:         Cross-chest stretch x 2 min          Alternating B shoulder horizontal abduction with blue TB x 15 reps        Alternating shoulder flexion/ext. With blue TB (X pattern) x 10 reps each way B supine protraction 4# x 15 reps R sidelying:         L shoulder ER with 1# x 10 reps        L shoulder abduction 1# x 10 reps          L shoulder circles in horizontal abduction 90 dg position CW, CCW 1# x 20 reps each way          PT Long Term Goals - 03/17/16 1612      PT LONG TERM GOAL #1   Title independent with HEP (04/20/16)   Status On-going     PT LONG TERM GOAL #2   Title perform L shoulder ROM without pain (04/20/16)   Status On-going     PT LONG TERM GOAL #3   Title tolerate carrying up to 8# without pain for improved function (04/20/16)   Status On-going     PT LONG TERM GOAL #4   Title improve L shoulder strength to 4/5 for improved function (04/20/16)   Status On-going               Plan - 03/22/16 1603    Clinical Impression Statement  Pt. tolerated progression of shoulder and scapular strengthening activity today well ending session pain free.  Pt. L shoulder, "flare up" of 8/10 aching pain last night with no known trigger.  Most pain today  with yellow TB resisted ER.  Will plan to continue progressing L shoulder and scapular strengthening activity as tolerated.   PT Treatment/Interventions ADLs/Self Care Home Management;Cryotherapy;Electrical Stimulation;Iontophoresis 4mg /ml Dexamethasone;Moist Heat;Ultrasound;Therapeutic exercise;Therapeutic activities;Patient/family education;Manual techniques;Passive range of motion;Vasopneumatic Device;Taping;Dry needling   PT Next Visit Plan Scap stab strengthening, RTC strengthening, modalities PRN for pain      Patient will benefit from skilled therapeutic intervention in order to improve the following deficits and impairments:  Decreased range of motion, Postural dysfunction, Pain, Decreased strength  Visit Diagnosis: Chronic left shoulder pain  Abnormal posture  Muscle weakness (generalized)     Problem List Patient Active Problem List   Diagnosis Date Noted  . Fever 12/22/2010  . General medical examination 12/22/2010  . LOM 01/26/2010    Kermit Balo, PTA 03/22/2016, 4:41 PM  [facility] [address]  [address] [place], [place], [address] Phone: [phone]   Fax:  [phone]  Name: FRANK PILGER MRN: [id number] Date of Birth: [birth year]

## 2016-03-24 ENCOUNTER — Ambulatory Visit: Payer: 59 | Admitting: Physical Therapy

## 2016-03-24 DIAGNOSIS — M25512 Pain in left shoulder: Principal | ICD-10-CM

## 2016-03-24 DIAGNOSIS — G8929 Other chronic pain: Secondary | ICD-10-CM

## 2016-03-24 DIAGNOSIS — M6281 Muscle weakness (generalized): Secondary | ICD-10-CM

## 2016-03-24 DIAGNOSIS — R293 Abnormal posture: Secondary | ICD-10-CM

## 2016-03-24 NOTE — Therapy (Signed)
[facility] [place] [address]  [address] [place], [place], [address] Phone: [phone]   Fax:  [phone]  Physical Therapy Treatment  Patient Details  Name: Meredith [name] MRN: [id number] Date of Birth: 1991/05/11 Referring Provider: Abbe AmsterdamJessica Copland, MD  Encounter Date: 03/24/2016      PT End of Session - 03/24/16 1612    Visit Number 4   Number of Visits 12   Date for PT Re-Evaluation 04/20/16   PT Start Time 1535   PT Stop Time 1613   PT Time Calculation (min) 38 min   Activity Tolerance Patient tolerated treatment well   Behavior During Therapy [facility] for tasks assessed/performed      Past Medical History:  Diagnosis Date  . Hypoglycemia     No past surgical history on file.  There were no vitals filed for this visit.      Subjective Assessment - 03/24/16 1536    Subjective hasn't had any pain since Tuesday (which was only an ache).  doing well today.  has been working out a little more and seems to be helping.   Limitations Lifting;Sitting   Patient Stated Goals improve pain, strengthen arm   Currently in Pain? No/denies                         [facility] Treatment/Exercise - 03/24/16 1537      Shoulder Exercises: Supine   Horizontal ABduction Both;15 reps;Theraband   Theraband Level (Shoulder Horizontal ABduction) Level 1 (Yellow)   Horizontal ABduction Limitations on foam roll   External Rotation Both;15 reps;Theraband   Theraband Level (Shoulder External Rotation) Level 1 (Yellow)   External Rotation Limitations on foam roll   Flexion Left;15 reps;Theraband   Theraband Level (Shoulder Flexion) Level 1 (Yellow)   Flexion Limitations on foam roll   Other Supine Exercises D1/D2 x 15 each direction with yellow theraband     Shoulder Exercises: Standing   External Rotation Left;10 reps   Theraband Level (Shoulder External Rotation) Level 2 (Red)   External Rotation Limitations pain  up to 8/10 with 10 reps so stopped exercise   Internal Rotation Left;15 reps;Theraband   Theraband Level (Shoulder Internal Rotation) Level 2 (Red)   Extension Both;15 reps;Theraband   Theraband Level (Shoulder Extension) Level 3 (Green)   Extension Limitations 2 sets   Retraction Both;15 reps;Theraband   Theraband Level (Shoulder Retraction) Level 3 (Green)   Retraction Limitations 2 sets     Shoulder Exercises: ROM/Strengthening   UBE (Upper Arm Bike) L 3.5 x 6 min (3' fwd/3' bwd)   Cybex Row Limitations 20# 2x15 narrow grip     Shoulder Exercises: Stretch   Other Shoulder Stretches on foam roll chest stretch x 2-3 min     Modalities   Modalities Iontophoresis     Iontophoresis   Type of Iontophoresis Dexamethasone   Location L shoulder   Dose 1.0 mL   Time 6 hour patch                PT Education - 03/24/16 1612    Education provided Yes   Education Details ionto   Person(s) Educated Patient   Methods Explanation;Handout   Comprehension Verbalized understanding             PT Long Term Goals - 03/17/16 1612      PT LONG TERM GOAL #1   Title independent with HEP (04/20/16)   Status  On-going     PT LONG TERM GOAL #2   Title perform L shoulder ROM without pain (04/20/16)   Status On-going     PT LONG TERM GOAL #3   Title tolerate carrying up to 8# without pain for improved function (04/20/16)   Status On-going     PT LONG TERM GOAL #4   Title improve L shoulder strength to 4/5 for improved function (04/20/16)   Status On-going               Plan - 03/24/16 1612    Clinical Impression Statement Pt continues to have pain with RTC strengthening exercises up to 8/10 which quickly resolves.  Ionto initiated to see if inflammation can decrease in order to tolerate exercises well.  Will continue to benefit from PT to maximize function.   PT Treatment/Interventions ADLs/Self Care Home Management;Cryotherapy;Electrical Stimulation;Iontophoresis 4mg /ml  Dexamethasone;Moist Heat;Ultrasound;Therapeutic exercise;Therapeutic activities;Patient/family education;Manual techniques;Passive range of motion;Vasopneumatic Device;Taping;Dry needling   PT Next Visit Plan Scap stab strengthening, RTC strengthening, modalities PRN for pain      Patient will benefit from skilled therapeutic intervention in order to improve the following deficits and impairments:  Decreased range of motion, Postural dysfunction, Pain, Decreased strength  Visit Diagnosis: Chronic left shoulder pain  Abnormal posture  Muscle weakness (generalized)     Problem List Patient Active Problem List   Diagnosis Date Noted  . Fever 12/22/2010  . General medical examination 12/22/2010  . LOM 01/26/2010       Clarita Crane, PT, DPT 03/24/16 4:14 PM    [facility] [address]  [address] [place], [place], [address] Phone: [phone]   Fax:  [phone]  Name: Meredith [name] MRN: [id number] Date of Birth: [birth year]

## 2016-03-24 NOTE — Patient Instructions (Signed)

## 2016-03-29 ENCOUNTER — Ambulatory Visit: Payer: 59

## 2016-03-29 DIAGNOSIS — M25512 Pain in left shoulder: Principal | ICD-10-CM

## 2016-03-29 DIAGNOSIS — M6281 Muscle weakness (generalized): Secondary | ICD-10-CM

## 2016-03-29 DIAGNOSIS — R293 Abnormal posture: Secondary | ICD-10-CM

## 2016-03-29 DIAGNOSIS — G8929 Other chronic pain: Secondary | ICD-10-CM

## 2016-03-29 NOTE — Therapy (Signed)
[facility] [place] [address]  [address] [place], [place], [address] Phone: [phone]   Fax:  [phone]  Physical Therapy Treatment  Patient Details  Name: Meredith [name] MRN: [id number] Date of Birth: 1991/01/03 Referring Provider: Abbe AmsterdamJessica Copland, MD  Encounter Date: 03/29/2016      PT End of Session - 03/29/16 1537    Visit Number 5   Number of Visits 12   Date for PT Re-Evaluation 04/20/16   PT Start Time 1534   PT Stop Time 1615   PT Time Calculation (min) 41 min   Activity Tolerance Patient tolerated treatment well   Behavior During Therapy [facility] for tasks assessed/performed      Past Medical History:  Diagnosis Date  . Hypoglycemia     No past surgical history on file.  There were no vitals filed for this visit.      Subjective Assessment - 03/29/16 1539    Subjective pt. reporting, "complete relief" from iontophoresis patch applied last treatment.     Patient Stated Goals improve pain, strengthen arm   Currently in Pain? No/denies   Pain Score 0-No pain   Multiple Pain Sites No             [facility] Adult PT Treatment/Exercise - 03/29/16 1544      Shoulder Exercises: Supine   Theraband Level (Shoulder Horizontal ABduction) Level 4 (Blue)   Horizontal ABduction Limitations laying on 1/2 foam bolster x 15 reps   External Rotation Both;15 reps;Theraband   Theraband Level (Shoulder External Rotation) Level 1 (Yellow)   External Rotation Limitations on foam roll   Other Supine Exercises alternating flexion/ext. (x pattern) with blue TB x 15 reps laying on 1/2 foam bolser     Shoulder Exercises: Standing   External Rotation Left;10 reps   Theraband Level (Shoulder External Rotation) Level 1 (Yellow)   External Rotation Limitations pain up to 8/10 with 10 reps so stopped exercise   Internal Rotation Left;15 reps;Theraband   Theraband Level (Shoulder Internal Rotation) Level 2 (Red)   Internal  Rotation Limitations 2 sets   Extension Both;15 reps;Theraband   Theraband Level (Shoulder Extension) Level 3 (Green)   Extension Limitations 2 sets   Retraction Both;15 reps;Theraband   Theraband Level (Shoulder Retraction) Level 4 (Blue)   Retraction Limitations 2 sets     Shoulder Exercises: Lawyertretch   Corner Stretch 3 reps;30 seconds   Corner Stretch Limitations arms at 90 dg    Cross Chest Stretch Limitations supine on 1/2 foam bolster x 2 min      Modalities   Modalities Iontophoresis  patch 2#/6     Iontophoresis   Type of Iontophoresis Dexamethasone   Location L shoulder   Dose 1.0 mL   Time 6 hour patch           PT Long Term Goals - 03/17/16 1612      PT LONG TERM GOAL #1   Title independent with HEP (04/20/16)   Status On-going     PT LONG TERM GOAL #2   Title perform L shoulder ROM without pain (04/20/16)   Status On-going     PT LONG TERM GOAL #3   Title tolerate carrying up to 8# without pain for improved function (04/20/16)   Status On-going     PT LONG TERM GOAL #4   Title improve L shoulder strength to 4/5 for improved function (04/20/16)   Status On-going  Plan - 03/29/16 1539    Clinical Impression Statement Pt. reporting great benefit with complete L shoulder pain relief over weekend from ionto patch 1#.  Iontophoresis patch 2#/6 applied to L shoulder today at end of treatment.  Today's treatment continued shoulder/scapular strengthening activity focus. pt. tolerated all activities well with exception of standing yellow TB resisted shoulder ER.  Supine ER well tolerated today pain free.  Pt. progressing well today and will plan to monitor response to ionto patch 2#/6 next treatment.   PT Treatment/Interventions ADLs/Self Care Home Management;Cryotherapy;Electrical Stimulation;Iontophoresis 4mg /ml Dexamethasone;Moist Heat;Ultrasound;Therapeutic exercise;Therapeutic activities;Patient/family education;Manual techniques;Passive range  of motion;Vasopneumatic Device;Taping;Dry needling   PT Next Visit Plan Scap stab strengthening, RTC strengthening, modalities PRN for pain      Patient will benefit from skilled therapeutic intervention in order to improve the following deficits and impairments:  Decreased range of motion, Postural dysfunction, Pain, Decreased strength  Visit Diagnosis: Chronic left shoulder pain  Abnormal posture  Muscle weakness (generalized)     Problem List Patient Active Problem List   Diagnosis Date Noted  . Fever 12/22/2010  . General medical examination 12/22/2010  . LOM 01/26/2010    Kermit Balo, PTA 03/29/16 5:38 PM  [facility] [address]  [address] [place], [place], [address] Phone: [phone]   Fax:  [phone]  Name: Meredith [name] MRN: [id number] Date of Birth: [birth year]

## 2016-03-31 ENCOUNTER — Ambulatory Visit: Payer: 59 | Admitting: Physical Therapy

## 2016-03-31 DIAGNOSIS — R293 Abnormal posture: Secondary | ICD-10-CM

## 2016-03-31 DIAGNOSIS — M25512 Pain in left shoulder: Secondary | ICD-10-CM | POA: Diagnosis not present

## 2016-03-31 DIAGNOSIS — M6281 Muscle weakness (generalized): Secondary | ICD-10-CM

## 2016-03-31 DIAGNOSIS — G8929 Other chronic pain: Secondary | ICD-10-CM

## 2016-03-31 NOTE — Therapy (Signed)
[facility] [address]  [address] [place], [place], [address] Phone: [phone]   Fax:  [phone]  Physical Therapy Treatment  Patient Details  Name: Meredith [name] MRN: [id number] Date of Birth: 1991-04-03 Referring Provider: Abbe Amsterdam, MD  Encounter Date: 03/31/2016      PT End of Session - 03/31/16 1610    Visit Number 6   Number of Visits 12   Date for PT Re-Evaluation 04/20/16   PT Start Time 1532   PT Stop Time 1610   PT Time Calculation (min) 38 min   Activity Tolerance Patient tolerated treatment well   Behavior During Therapy [facility] for tasks assessed/performed      Past Medical History:  Diagnosis Date  . Hypoglycemia     No past surgical history on file.  There were no vitals filed for this visit.      Subjective Assessment - 03/31/16 1534    Subjective shoulder is feeling good, had a little soreness after last session   Patient Stated Goals improve pain, strengthen arm   Currently in Pain? Yes   Pain Score 1    Pain Location Shoulder   Pain Orientation Left   Pain Descriptors / Indicators Tightness;Sore   Pain Type Acute pain   Pain Onset More than a month ago   Pain Frequency Intermittent   Aggravating Factors  carrying items   Pain Relieving Factors nothing, ibuprofen                         [facility] Adult PT Treatment/Exercise - 03/31/16 1537      Shoulder Exercises: Supine   Protraction Left;15 reps;Weights   Protraction Weight (lbs) 4   Flexion Left;15 reps;Theraband   Shoulder Flexion Weight (lbs) 2     Shoulder Exercises: Prone   Retraction Left;15 reps;Weights   Retraction Weight (lbs) 4   Flexion Left;15 reps;Weights   Flexion Weight (lbs) 2   Extension Left;15 reps;Weights   Extension Weight (lbs) 3   Horizontal ABduction 1 Left;15 reps;Weights   Horizontal ABduction 1 Weight (lbs) 2     Shoulder Exercises: Sidelying   External Rotation Left;15  reps;Weights   External Rotation Weight (lbs) 2   External Rotation Limitations no weight x last 5 reps due to pain with 2#   ABduction Left;15 reps;Weights   ABduction Weight (lbs) 2     Shoulder Exercises: Standing   Retraction Both;10 reps;Theraband   Theraband Level (Shoulder Retraction) Level 4 (Blue)     Shoulder Exercises: ROM/Strengthening   UBE (Upper Arm Bike) L 4.0 x 6 min (3' fwd/3' bwd)   Cybex Row Limitations 25# x15 narrow grip; x15 wide grip (slight pain with eccentric control)                     PT Long Term Goals - 03/17/16 1612      PT LONG TERM GOAL #1   Title independent with HEP (04/20/16)   Status On-going     PT LONG TERM GOAL #2   Title perform L shoulder ROM without pain (04/20/16)   Status On-going     PT LONG TERM GOAL #3   Title tolerate carrying up to 8# without pain for improved function (04/20/16)   Status On-going     PT LONG TERM GOAL #4   Title improve L shoulder strength to 4/5 for improved function (04/20/16)   Status On-going  Plan - 03/31/16 1611    Clinical Impression Statement Pt had slight increase in pain with external rotation and retraction exercises but resolved pain to 0/10 after session.  Pt with increased work schedule and unsure if she will be able to attend many more sessions.  Given decreased pain x past week if pt continues to be pain free over weekend may be ready to hold PT x 30 days and continue HEP at home.  If pain returns will plan to see through POC.   PT Treatment/Interventions ADLs/Self Care Home Management;Cryotherapy;Electrical Stimulation;Iontophoresis 4mg /ml Dexamethasone;Moist Heat;Ultrasound;Therapeutic exercise;Therapeutic activities;Patient/family education;Manual techniques;Passive range of motion;Vasopneumatic Device;Taping;Dry needling   PT Next Visit Plan Scap stab strengthening, RTC strengthening, modalities PRN for pain; hold v/s schedule more appts   Consulted and Agree  with Plan of Care Patient      Patient will benefit from skilled therapeutic intervention in order to improve the following deficits and impairments:  Decreased range of motion, Postural dysfunction, Pain, Decreased strength  Visit Diagnosis: Chronic left shoulder pain  Abnormal posture  Muscle weakness (generalized)     Problem List Patient Active Problem List   Diagnosis Date Noted  . Fever 12/22/2010  . General medical examination 12/22/2010  . LOM 01/26/2010       Clarita CraneStephanie F Adilynne Fitzwater, PT, DPT 03/31/16 4:14 PM    [facility] [place] [address]  [address] [place], [place], [address] Phone: [phone]   Fax:  [phone]  Name: Meredith CholRachel K [name] MRN: [id number] Date of Birth: 23-Sep-1990

## 2016-04-05 ENCOUNTER — Ambulatory Visit: Payer: 59

## 2016-04-05 DIAGNOSIS — M6281 Muscle weakness (generalized): Secondary | ICD-10-CM

## 2016-04-05 DIAGNOSIS — R293 Abnormal posture: Secondary | ICD-10-CM

## 2016-04-05 DIAGNOSIS — M25512 Pain in left shoulder: Secondary | ICD-10-CM

## 2016-04-05 DIAGNOSIS — G8929 Other chronic pain: Secondary | ICD-10-CM

## 2016-04-05 NOTE — Therapy (Addendum)
[place] [address]  [place], [place], [address] Phone: [phone]   Fax:  [phone]  Physical Therapy Treatment  Patient Details  Name: Meredith [name] MRN: [id number] Date of Birth: 05-02-1991 Referring Provider: Lamar Blinks, MD  Encounter Date: 04/05/2016      PT End of Session - 04/05/16 1545    Visit Number 7   Number of Visits 12   Date for PT Re-Evaluation 04/20/16   PT Start Time 1542  started late due to building wide fire drill.   PT Stop Time 1610   PT Time Calculation (min) 28 min   Activity Tolerance Patient tolerated treatment well   Behavior During Therapy WFL for tasks assessed/performed      Past Medical History:  Diagnosis Date  . Hypoglycemia     No past surgical history on file.  There were no vitals filed for this visit.      Subjective Assessment - 04/05/16 1613    Subjective Pt. reporting she has been nearly pain free over this past week and still wants to be put on hold from therapy.   Patient Stated Goals improve pain, strengthen arm   Currently in Pain? No/denies   Pain Score 0-No pain   Multiple Pain Sites No         Today's treatment:  Therex: Standing L shoulder Internal rotation red TB x 10 reps  Standing L shoulder external rotation with yellow TB x 10 reps  Goal Testing   MMT       [facility] PT Assessment - 04/05/16 1603      Strength   Overall Strength Comments only pain with flexion today pain free with abduction   Strength Assessment Site Shoulder   Right Shoulder Flexion 4/5   Right Shoulder ABduction 4/5   Right Shoulder Internal Rotation 5/5   Right Shoulder External Rotation 4/5   Left Shoulder Flexion 4-/5   Left Shoulder ABduction 4/5   Left Shoulder Internal Rotation 4/5   Left Shoulder External Rotation 4-/5             PT Long Term Goals - 04/05/16 1550      PT LONG TERM GOAL #1   Title independent with HEP  (04/20/16)   Status Achieved     PT LONG TERM GOAL #2   Title perform L shoulder ROM without pain (04/20/16)   Status Achieved     PT LONG TERM GOAL #3   Title tolerate carrying up to 8# without pain for improved function (04/20/16)   Status Achieved     PT LONG TERM GOAL #4   Title improve L shoulder strength to 4/5 for improved function (04/20/16)   Status Partially Met               Plan - 04/05/16 1546    Clinical Impression Statement Pt. reporting she has been nearly pain free over this past week.  Pt. able to meet all LTG's and partially met L shoulder strength goal.  Pt. demo'd 4/5 strength with most L shoulder motions with exception of ER and flexion at 4-/5 strength.  Pt. reporting she is still comfortable transitioning to home program and is comfortable being put on hold from PT.  Hold discussed with PT with PT agreeing to hold for pt.     PT Treatment/Interventions ADLs/Self Care Home Management;Cryotherapy;Electrical Stimulation;Iontophoresis '4mg'$ /ml Dexamethasone;Moist Heat;Ultrasound;Therapeutic exercise;Therapeutic activities;Patient/family education;Manual techniques;Passive range of motion;Vasopneumatic Device;Taping;Dry needling   PT  Next Visit Plan pt. going on hold       Patient will benefit from skilled therapeutic intervention in order to improve the following deficits and impairments:  Decreased range of motion, Postural dysfunction, Pain, Decreased strength  Visit Diagnosis: Chronic left shoulder pain  Abnormal posture  Muscle weakness (generalized)  Acute pain of left shoulder     Problem List Patient Active Problem List   Diagnosis Date Noted  . Fever 12/22/2010  . General medical examination 12/22/2010  . LOM 01/26/2010    Bess Harvest, PTA 04/05/16 4:49 PM  [place] [address]  [place], [place], [address] Phone: [phone]   Fax:  [phone]  Name: Meredith [name] MRN: [id number] Date of Birth: 10-10-1990       PHYSICAL THERAPY DISCHARGE SUMMARY  Visits from Start of Care: 7  Current functional level related to goals / functional outcomes: See above   Remaining deficits: n/a   Education / Equipment: HEP, posture  Plan: Patient agrees to discharge.  Patient goals were met. Patient is being discharged due to meeting the stated rehab goals.  ?????    Laureen Abrahams, PT, DPT 05/09/16 8:25 AM  [facility] [place], [place] [address]  [phone] (office) [phone] (fax)

## 2016-12-08 ENCOUNTER — Ambulatory Visit (INDEPENDENT_AMBULATORY_CARE_PROVIDER_SITE_OTHER): Payer: Managed Care, Other (non HMO) | Admitting: Family

## 2016-12-08 ENCOUNTER — Ambulatory Visit (HOSPITAL_BASED_OUTPATIENT_CLINIC_OR_DEPARTMENT_OTHER)
Admission: RE | Admit: 2016-12-08 | Discharge: 2016-12-08 | Disposition: A | Discharge status: Home / Self Care | Payer: Managed Care, Other (non HMO) | Source: Ambulatory Visit | Attending: Family | Admitting: Family

## 2016-12-08 ENCOUNTER — Encounter: Payer: Self-pay | Admitting: Family

## 2016-12-08 VITALS — BP 123/78 | HR 87 | Temp 99.2°F | Resp 16 | Ht 65.0 in | Wt 210.0 lb

## 2016-12-08 DIAGNOSIS — M546 Pain in thoracic spine: Secondary | ICD-10-CM | POA: Diagnosis not present

## 2016-12-08 DIAGNOSIS — G8929 Other chronic pain: Secondary | ICD-10-CM

## 2016-12-08 LAB — POCT URINE HCG BY VISUAL COLOR COMPARISON TESTS: PREG TEST UR: NEGATIVE

## 2016-12-08 MED ORDER — CYCLOBENZAPRINE HCL 5 MG PO TABS: 5.0000 mg | ORAL_TABLET | Freq: Every evening | ORAL | 1 refills | Status: DC | PRN

## 2016-12-08 MED ORDER — MELOXICAM 7.5 MG PO TABS: 7.5000 mg | ORAL_TABLET | Freq: Every day | ORAL | 0 refills | Status: DC

## 2016-12-08 NOTE — Progress Notes (Signed)
   Subjective:    Patient ID: Meredith [name], female    DOB: 03-Mar-1991, 26 y.o.   MRN: [id number]  HPI  Ms. Belinda FisherLovette is a 26 yr old female who presents today with chief complaint of right sided low back pain.  She reports that back pain has been present since March 2017. Used to only occur with driving.  Now it is constant.  .sitting up straight does not hurt.  Sleeping seems to make it hurt more.  Reports that pain is gradually worsening. Pain is not relieved by chiropractic. Pain is non-radiating.  Has used ibuprofen and aleve with occasional brief improvement in her symptoms. Denies bowl/bladder incontinenc.   Review of Systems    see HPI  Past Medical History:  Diagnosis Date  . Hypoglycemia      Social History   Social History  . Marital status: Single    Spouse name: N/A  . Number of children: N/A  . Years of education: N/A   Occupational History  . Not on file.   Social History Main Topics  . Smoking status: Never Smoker  . Smokeless tobacco: Never Used  . Alcohol use No  . Drug use: No  . Sexual activity: Not on file   Other Topics Concern  . Not on file   Social History Narrative  . No narrative on file    No past surgical history on file.  Family History  Problem Relation Age of Onset  . Hyperlipidemia Father     Allergies  Allergen Reactions  . Penicillins     No current outpatient prescriptions on file prior to visit.   No current facility-administered medications on file prior to visit.     BP 123/78 (BP Location: Left Arm, Cuff Size: Large)   Pulse 87   Temp 99.2 F (37.3 C) (Oral)   Resp 16   Ht 5\' 5"  (1.651 m)   Wt 210 lb (95.3 kg)   LMP 11/18/2016   SpO2 99%   BMI 34.95 kg/m    Objective:   Physical Exam  Constitutional: She appears well-developed and well-nourished.  Cardiovascular: Normal rate, regular rhythm and normal heart sounds.   No murmur heard. Pulmonary/Chest: Effort normal and breath sounds normal. No  respiratory distress. She has no wheezes.  Abdominal: There is no CVA tenderness.  Musculoskeletal:       Cervical back: She exhibits no tenderness.       Thoracic back: She exhibits no tenderness.       Lumbar back: She exhibits no tenderness.  Psychiatric: She has a normal mood and affect. Her behavior is normal. Judgment and thought content normal.          Assessment & Plan:  Back pain- New. pain is located several inches to the right of her thoracic spine. Most likely cause is musculoskeletal pain.  Will begin with trial of meloxicam, prn flexeril and a baseline x ray of her thoracic spine. (check HCG prior to x ray). If x ray is unrevealing and if symptoms fail to improve may need more detailed imaging of the area such as MRI to rule out an other potential cause.  She is advised to follow up in 3 weeks.

## 2016-12-08 NOTE — Addendum Note (Signed)
Addended by: Mervin KungFERGERSON, [place] A on: 12/08/2016 03:23 PM   Modules accepted: Orders

## 2016-12-08 NOTE — Patient Instructions (Signed)
Please begin meloxicam once daily. You may use flexeril as needed at bedtime. Call if new/worsening symptoms.

## 2017-01-04 ENCOUNTER — Telehealth: Payer: Self-pay | Admitting: Family Medicine

## 2017-01-04 ENCOUNTER — Ambulatory Visit (INDEPENDENT_AMBULATORY_CARE_PROVIDER_SITE_OTHER): Payer: Managed Care, Other (non HMO) | Admitting: Family

## 2017-01-04 VITALS — BP 122/71 | HR 91 | Temp 99.3°F | Ht 65.0 in | Wt 208.6 lb

## 2017-01-04 DIAGNOSIS — M546 Pain in thoracic spine: Secondary | ICD-10-CM

## 2017-01-04 DIAGNOSIS — G8929 Other chronic pain: Secondary | ICD-10-CM | POA: Diagnosis not present

## 2017-01-04 MED ORDER — MELOXICAM 7.5 MG PO TABS: 7.5000 mg | ORAL_TABLET | Freq: Every day | ORAL | 0 refills | Status: DC | PRN

## 2017-01-04 NOTE — Telephone Encounter (Signed)
[facility] called in to request imaging orders to be faxed to them at:   [phone]

## 2017-01-04 NOTE — Telephone Encounter (Signed)
Insurance requesting re fax order ensuring orders reflect MRI thoracic spine w/out contrast

## 2017-01-04 NOTE — Progress Notes (Signed)
Subjective:    Patient ID: Meredith CholRachel K Savoia, female    DOB: [birth year], 26 y.o.   MRN: [id number]  HPI  Patient is a 26 year old female who presents today for follow-up on her chronic right sided thoracic back pain. She was last seen for this complaint on 12/08/2016. She reported at that time that her pain had been present since March 2017. She was given a prescription to begin meloxicam once daily. A plain film of the thoracic spine was obtained last visit and it noted no acute abnormality.  Today she reports that the pain is unchanged in quality since her visit on June 28, however the pain has been more frequent in nature. Reports some improvement with the use of muscle relaxers but they make her drowsy.   meloxicam does help temporarily. However pain always returns. Certain positions make the pain worse. Pain is intermittent. Can be excruciating at times. She denies lower extremity numbness or weakness. She denies bowel or bladder incontinence.  Review of Systems See HPI  Past Medical History:  Diagnosis Date  . Hypoglycemia      Social History   Social History  . Marital status: Single    Spouse name: N/A  . Number of children: N/A  . Years of education: N/A   Occupational History  . Not on file.   Social History Main Topics  . Smoking status: Never Smoker  . Smokeless tobacco: Never Used  . Alcohol use No  . Drug use: No  . Sexual activity: Not on file   Other Topics Concern  . Not on file   Social History Narrative  . No narrative on file    No past surgical history on file.  Family History  Problem Relation Age of Onset  . Hyperlipidemia Father     Allergies  Allergen Reactions  . Penicillins Rash    Current Outpatient Prescriptions on File Prior to Visit  Medication Sig Dispense Refill  . cyclobenzaprine (FLEXERIL) 5 MG tablet Take 1 tablet (5 mg total) by mouth at bedtime as needed for muscle spasms. For back pain 30 tablet 1  . meloxicam (MOBIC)  7.5 MG tablet Take 1 tablet (7.5 mg total) by mouth daily. 20 tablet 0   No current facility-administered medications on file prior to visit.     BP 122/71   Pulse 91   Temp 99.3 F (37.4 C) (Oral)   Ht 5\' 5"  (1.651 m)   Wt 208 lb 9.6 oz (94.6 kg)   LMP 12/15/2016   SpO2 99%   BMI 34.71 kg/m       Objective:   Physical Exam  Constitutional: She is oriented to person, place, and time. She appears well-developed and well-nourished.  Cardiovascular: Normal rate, regular rhythm and normal heart sounds.   No murmur heard. Pulmonary/Chest: Effort normal and breath sounds normal. No respiratory distress. She has no wheezes.  Musculoskeletal:       Cervical back: She exhibits no tenderness.       Thoracic back: She exhibits no tenderness.       Lumbar back: She exhibits no bony tenderness.  Neurological: She is alert and oriented to person, place, and time.  Reflex Scores:      Patellar reflexes are 2+ on the right side and 2+ on the left side. Skin: Skin is warm and dry.  Psychiatric: She has a normal mood and affect. Her behavior is normal. Judgment and thought content normal.  Assessment & Plan:  Thoracic back pain-right sided, frequency is worsening. Continue meloxicam as needed. At this point I think she'll benefit from an MRI to further assess the area, as well as a referral for physical therapy. We also discussed application of an over-the-counter lidocaine patch.

## 2017-01-04 NOTE — Telephone Encounter (Signed)
Per [employer], [facility] requesting a written copy of MRI order be faxed.  Order printed and faxed to [facility] at [phone].  Fax confirmation received.

## 2017-01-04 NOTE — Patient Instructions (Signed)
He should be contacted about scheduling your MRI as well as her physical therapy.  You may continue to use meloxicam once daily as needed.  You may also benefit from applying an over-the-counter patch such as salon pas to the affected area.

## 2017-01-05 NOTE — Telephone Encounter (Signed)
[facility].  They confirmed that they had received order.  Nothing further needed at this time.

## 2017-01-24 ENCOUNTER — Ambulatory Visit: Payer: Managed Care, Other (non HMO) | Attending: Family | Admitting: Physical Therapy

## 2017-01-24 DIAGNOSIS — R29898 Other symptoms and signs involving the musculoskeletal system: Secondary | ICD-10-CM

## 2017-01-24 DIAGNOSIS — M546 Pain in thoracic spine: Secondary | ICD-10-CM | POA: Diagnosis not present

## 2017-01-24 NOTE — Patient Instructions (Signed)
Scapular Retraction (Standing)    With arms at sides, pinch shoulder blades together. Repeat _15___ times per set. Do _2___ sets per session.    Open Book    Each side - 5-10 times holding 10 seconds   Self massage with ball against wall  Trigger Point Dry Needling  . What is Trigger Point Dry Needling (DN)? o DN is a physical therapy technique used to treat muscle pain and dysfunction. Specifically, DN helps deactivate muscle trigger points (muscle knots).  o A thin filiform needle is used to penetrate the skin and stimulate the underlying trigger point. The goal is for a local twitch response (LTR) to occur and for the trigger point to relax. No medication of any kind is injected during the procedure.   . What Does Trigger Point Dry Needling Feel Like?  o The procedure feels different for each individual patient. Some patients report that they do not actually feel the needle enter the skin and overall the process is not painful. Very mild bleeding may occur. However, many patients feel a deep cramping in the muscle in which the needle was inserted. This is the local twitch response.   Marland Kitchen. How Will I feel after the treatment? o Soreness is normal, and the onset of soreness may not occur for a few hours. Typically this soreness does not last longer than two days.  o Bruising is uncommon, however; ice can be used to decrease any possible bruising.  o In rare cases feeling tired or nauseous after the treatment is normal. In addition, your symptoms may get worse before they get better, this period will typically not last longer than 24 hours.   . What Can I do After My Treatment? o Increase your hydration by drinking more water for the next 24 hours. o You may place ice or heat on the areas treated that have become sore, however, do not use heat on inflamed or bruised areas. Heat often brings more relief post needling. o You can continue your regular activities, but vigorous activity is  not recommended initially after the treatment for 24 hours. o DN is best combined with other physical therapy such as strengthening, stretching, and other therapies.

## 2017-01-24 NOTE — Therapy (Signed)
[facility] [place] [address]  [address] [place], [place], [address] Phone: [phone]   Fax:  [phone]  Physical Therapy Evaluation  Patient Details  Name: Meredith [name] Date of Birth: [birth year] Referring Provider: Sandford CrazeMelissa O'Sullivan  Encounter Date: 01/24/2017      PT End of Session - 01/24/17 1644    Visit Number 1   Number of Visits 12   PT Start Time 1533   PT Stop Time 1613   PT Time Calculation (min) 40 min   Activity Tolerance Patient tolerated treatment well   Behavior [facility] for tasks assessed/performed      Past Medical History:  Diagnosis Date  . Hypoglycemia     No past surgical history on file.  There were no vitals filed for this visit.       Subjective Assessment - 01/24/17 1534    Subjective Patient reporting R sided mid-back pain - has been constant for approx 1-1.5 years. Was most common with driving, now becoming more constant with all daily activities. Difficulty with sleeping. Had MRI last week - has not received results. Has not noticed anything to really help with pain other than sitting up straight. Often has to sleep on stomach to help with pain. Intermittent relief with ibuprofen. No radiation. Has not noticed any difficulty with UE ROM or strength. Does work out multiple times a week - back feels fine during activity. Does report a history of gallbladder issues - doesn't know if this is related.    Diagnostic tests Xray: normal; MRI: unknown currently   Patient Stated Goals improve pain   Currently in Pain? Yes   Pain Score 2   9/10 max   Pain Location Thoracic   Pain Orientation Right   Pain Descriptors / Indicators Aching;Discomfort;Dull   Pain Type Chronic pain   Pain Onset More than a month ago   Pain Frequency Intermittent   Aggravating Factors  nothing of note   Pain Relieving Factors positional change, ibuprofen            [facility] PT  Assessment - 01/24/17 1541      Assessment   Medical Diagnosis Chronic R sided thoracic back pain   Referring Provider Sandford CrazeMelissa O'Sullivan   Onset Date/Surgical Date --  1-1.5 years ago   Next MD Visit --  prn   Prior Therapy yes - not for this issue     Precautions   Precautions None     Restrictions   Weight Bearing Restrictions No     Balance Screen   Has the patient fallen in the past 6 months No   Has the patient had a decrease in activity level because of a fear of falling?  No   Is the patient reluctant to leave their home because of a fear of falling?  No     Prior Function   Level of Independence Independent   Vocation Full time employment   Vocation Requirements ASL interpreter   Leisure exercise     Cognition   Overall Cognitive Status Within Functional Limits for tasks assessed     Observation/Other Assessments   Focus on Therapeutic Outcomes (FOTO)  Thoracic Spine: 55 (45% limited, predicted 31% limited)     Sensation   Light Touch Appears Intact     Coordination   Gross Motor Movements are Fluid and Coordinated Yes     Posture/Postural Control   Posture/Postural Control Postural limitations  Postural Limitations Rounded Shoulders;Forward head     ROM / Strength   AROM / PROM / Strength AROM     AROM   Overall AROM Comments Full B UE and thoracic/lumbar AROM with no increase in pain     Palpation   Palpation comment tender to R sided thoracic paraspinals as well as             Objective measurements completed on examination: See above findings.          [facility] Adult PT Treatment/Exercise - 01/24/17 1541      Manual Therapy   Manual Therapy Soft tissue mobilization   Manual therapy comments patient prone   Soft tissue mobilization STM to R sided thoracic paraspinals - trigger point noted           Trigger Point Dry Needling - 01/24/17 1640    Consent Given? Yes   Education Handout Provided Yes   Muscles Treated Upper Body  Longissimus   Longissimus Response Twitch response elicited;Palpable increased muscle length              PT Education - 01/24/17 1641    Education provided Yes   Education Details exam findings, POC, HEP, DN treatment   Person(s) Educated Patient   Methods Explanation;Demonstration;Handout   Comprehension Verbalized understanding;Returned demonstration;Need further instruction          PT Short Term Goals - 01/24/17 1801      PT SHORT TERM GOAL #1   Title patient to be independent with initial HEP   Status New   Target Date 02/14/17           PT Long Term Goals - 01/24/17 1801      PT LONG TERM GOAL #1   Title patient to be independent with advanced HEP   Status New   Target Date 03/07/17     PT LONG TERM GOAL #2   Title patient to report pain reduction at R sided thoracic by >/= 50%   Status New   Target Date 03/07/17     PT LONG TERM GOAL #3   Title patient to report ability to perform work related tasks including prolonged sign language and sitting with no increase in pain   Status New   Target Date 03/07/17     PT LONG TERM GOAL #4   Title patient to report ability to participate in gym regimen with no increase in pain follwoing   Status New   Target Date 03/07/17                Plan - 01/24/17 1702    Clinical Impression Statement Meredith [name] is a 26 y/o female presenting to OPPT today regarding primary complaints of R sided thoracic back pain that has been present for approx 1-1.5 years with no known mechanism of injury. Patient reporting pain as achy and dull in nature and does not radiate into upper or lower back. Patient tender to palpation along R sided paraspinals as well as into lateral lats with trigger points noted. Patient and PT discussing DN treatment today, with both agreeing, and patient noting improvement in symptoms following treatment. patient to benefit fro PT to address pain and mobility concerns to allow for improved quality of  life.    Clinical Presentation Stable   Clinical Decision Making Low   Rehab Potential Good   PT Frequency 2x / week   PT Duration 6 weeks   PT Treatment/Interventions ADLs/Self Care Home Management;Cryotherapy;Electrical Stimulation;Moist  Heat;Traction;Ultrasound;Therapeutic exercise;Therapeutic activities;Patient/family education;Manual techniques;Passive range of motion;Vasopneumatic Device;Taping;Dry needling   Consulted and Agree with Plan of Care Patient      Patient will benefit from skilled therapeutic intervention in order to improve the following deficits and impairments:  Pain, Decreased activity tolerance  Visit Diagnosis: Pain in thoracic spine - Plan: PT plan of care cert/re-cert  Other symptoms and signs involving the musculoskeletal system - Plan: PT plan of care cert/re-cert     Problem List Patient Active Problem List   Diagnosis Date Noted  . Fever 12/22/2010  . General medical examination 12/22/2010  . LOM 01/26/2010     Kipp Laurence, PT, DPT 01/24/17 6:05 PM   [facility] [address]  [address] [place], [place], [address] Phone: [phone]   Fax:  [phone]  Name: Meredith [name] MRN: [id number] Date of Birth: November 30, 1990

## 2017-01-31 ENCOUNTER — Ambulatory Visit: Payer: Managed Care, Other (non HMO) | Admitting: Physical Therapy

## 2017-01-31 DIAGNOSIS — M546 Pain in thoracic spine: Secondary | ICD-10-CM | POA: Diagnosis not present

## 2017-01-31 DIAGNOSIS — R29898 Other symptoms and signs involving the musculoskeletal system: Secondary | ICD-10-CM

## 2017-01-31 NOTE — Therapy (Signed)
[facility] [address]  [address] [place], [place], [address] Phone: [phone]   Fax:  [phone]  Physical Therapy Treatment  Patient Details  Name: Meredith [name] MRN: [id number] Date of Birth: June 16, 1990 Referring Provider: Sandford [name]  Encounter Date: 01/31/2017      PT End of Session - 01/31/17 1538    Visit Number 2   Number of Visits 12   PT Start Time 1534   PT Stop Time 1616   PT Time Calculation (min) 42 min   Activity Tolerance Patient tolerated treatment well   Behavior During Therapy [facility] for tasks assessed/performed      Past Medical History:  Diagnosis Date  . Hypoglycemia     No past surgical history on file.  There were no vitals filed for this visit.      Subjective Assessment - 01/31/17 1535    Subjective doesn't know if she felt a difference from dry needling.    Diagnostic tests Xray: normal; MRI: unknown currently   Patient Stated Goals improve pain   Currently in Pain? Yes   Pain Score 1    Pain Location Thoracic   Pain Orientation Right;Lower   Pain Descriptors / Indicators Sore   Pain Type Chronic pain                         [facility] Adult PT Treatment/Exercise - 01/31/17 1540      Exercises   Exercises Shoulder     Shoulder Exercises: Standing   Horizontal ABduction Both;15 reps;Theraband   Theraband Level (Shoulder Horizontal ABduction) Level 2 (Red)   Horizontal ABduction Limitations with scap squeeze     Shoulder Exercises: ROM/Strengthening   UBE (Upper Arm Bike) L2.5 x 6 min (3/3)   Cybex Row 15 reps   Cybex Row Limitations 25# - narrow grip - 5 sec hold     Shoulder Exercises: Stretch   Other Shoulder Stretches foam roller to thoracic spine; foam roll self-mobilizations to thoracic spine   Other Shoulder Stretches red ball roll outs to L for R sided stretch 10 x 10 sec     Manual Therapy   Manual Therapy Soft tissue mobilization   Manual therapy comments patient prone   Soft tissue mobilization STM to R sided thoracic paraspinals - trigger point noted           Trigger Point Dry Needling - 01/31/17 1603    Consent Given? Yes   Muscles Treated Upper Body Longissimus  R side thoracic   Longissimus Response Twitch response elicited;Palpable increased muscle length                PT Short Term Goals - 01/31/17 1538      PT SHORT TERM GOAL #1   Title patient to be independent with initial HEP   Status On-going           PT Long Term Goals - 01/31/17 1539      PT LONG TERM GOAL #1   Title patient to be independent with advanced HEP   Status On-going     PT LONG TERM GOAL #2   Title patient to report pain reduction at R sided thoracic by >/= 50%   Status On-going     PT LONG TERM GOAL #3   Title patient to report ability to perform work related tasks including prolonged sign language and sitting with no increase in pain  Status On-going     PT LONG TERM GOAL #4   Title patient to report ability to participate in gym regimen with no increase in pain follwoing   Status On-going               Plan - 01/31/17 1539    Clinical Impression Statement Meredith [name] today reporting little to no change in pain pattern foolowing DN at last session, but wishing to continue with DN treatment. During DN, patient reporitng change in pain symtpoms with good twitch response noted. Session also focusing on gentle self-mobilizations and foam rolling with good toelrance. Will continue to progress towards goals.    PT Treatment/Interventions ADLs/Self Care Home Management;Cryotherapy;Electrical Stimulation;Moist Heat;Traction;Ultrasound;Therapeutic exercise;Therapeutic activities;Patient/family education;Manual techniques;Passive range of motion;Vasopneumatic Device;Taping;Dry needling   Consulted and Agree with Plan of Care Patient      Patient will benefit from skilled therapeutic intervention in order to  improve the following deficits and impairments:  Pain, Decreased activity tolerance  Visit Diagnosis: Pain in thoracic spine  Other symptoms and signs involving the musculoskeletal system     Problem List Patient Active Problem List   Diagnosis Date Noted  . Fever 12/22/2010  . General medical examination 12/22/2010  . LOM 01/26/2010      Meredith [name], PT, DPT 01/31/17 5:52 PM   [facility] [address]  [address] [place], [place], [address] Phone: [phone]   Fax:  [phone]  Name: Meredith [name] MRN: [id number] Date of Birth: 1991/04/23

## 2017-02-03 ENCOUNTER — Ambulatory Visit: Payer: Managed Care, Other (non HMO) | Admitting: Physical Therapy

## 2017-02-03 DIAGNOSIS — M546 Pain in thoracic spine: Secondary | ICD-10-CM | POA: Diagnosis not present

## 2017-02-03 DIAGNOSIS — R29898 Other symptoms and signs involving the musculoskeletal system: Secondary | ICD-10-CM

## 2017-02-03 NOTE — Therapy (Signed)
[facility] [address]  [address] [place], [place], [address] Phone: [phone]   Fax:  [phone]  Physical Therapy Treatment  Patient Details  Name: Meredith [name] MRN: [id number] Date of Birth: [birth year] Referring Provider: Sandford Craze  Encounter Date: 02/03/2017      PT End of Session - 02/03/17 0935    Visit Number 3   Number of Visits 12   PT Start Time 0933   PT Stop Time 1012   PT Time Calculation (min) 39 min   Activity Tolerance Patient tolerated treatment well   Behavior During Therapy [facility] for tasks assessed/performed      Past Medical History:  Diagnosis Date  . Hypoglycemia     No past surgical history on file.  There were no vitals filed for this visit.      Subjective Assessment - 02/03/17 0935    Subjective contiues to have pain when lying down and when driving   Diagnostic tests Xray: normal; MRI: unknown currently   Patient Stated Goals improve pain   Currently in Pain? No/denies   Pain Score 0-No pain                         [facility] Adult PT Treatment/Exercise - 02/03/17 0936      Shoulder Exercises: Supine   Horizontal ABduction Strengthening;Both;15 reps;Theraband   Theraband Level (Shoulder Horizontal ABduction) Level 3 (Green)   Horizontal ABduction Limitations hooklying on foam roll   External Rotation Strengthening;Both;15 reps;Theraband   Theraband Level (Shoulder External Rotation) Level 3 (Green)   External Rotation Limitations hooklying on foam roll   Flexion Right;15 reps;Theraband   Theraband Level (Shoulder Flexion) Level 3 (Green)   Flexion Limitations hooklying on foam roll     Shoulder Exercises: Standing   Row Both;15 reps;Theraband   Theraband Level (Shoulder Row) Level 3 (Green)     Shoulder Exercises: ROM/Strengthening   UBE (Upper Arm Bike) L2 x 6 min (3/3)   Wall Pushups 15 reps   Pushups Limitations on orange pball   Other  ROM/Strengthening Exercises BATCA pull down - 15# x 15 reps     Shoulder Exercises: Stretch   Other Shoulder Stretches childs pose + L lean with PT providing STM to thoracic paraspinals   Other Shoulder Stretches theracane to R sided thoracic paraspinals                  PT Short Term Goals - 01/31/17 1538      PT SHORT TERM GOAL #1   Title patient to be independent with initial HEP   Status On-going           PT Long Term Goals - 01/31/17 1539      PT LONG TERM GOAL #1   Title patient to be independent with advanced HEP   Status On-going     PT LONG TERM GOAL #2   Title patient to report pain reduction at R sided thoracic by >/= 50%   Status On-going     PT LONG TERM GOAL #3   Title patient to report ability to perform work related tasks including prolonged sign language and sitting with no increase in pain   Status On-going     PT LONG TERM GOAL #4   Title patient to report ability to participate in gym regimen with no increase in pain follwoing   Status On-going  Plan - 02/03/17 0935    Clinical Impression Statement Patient continues to report R sided thoracic pain - able to be located by palpation with pain reproduction with deep pressure. Has not noticed much of a difference with DN, therefore deferred today. Patient able to perform all strengthening and stretching based activities with little issue.    PT Treatment/Interventions ADLs/Self Care Home Management;Cryotherapy;Electrical Stimulation;Moist Heat;Traction;Ultrasound;Therapeutic exercise;Therapeutic activities;Patient/family education;Manual techniques;Passive range of motion;Vasopneumatic Device;Taping;Dry needling   Consulted and Agree with Plan of Care Patient      Patient will benefit from skilled therapeutic intervention in order to improve the following deficits and impairments:  Pain, Decreased activity tolerance  Visit Diagnosis: Pain in thoracic spine  Other  symptoms and signs involving the musculoskeletal system     Problem List Patient Active Problem List   Diagnosis Date Noted  . Fever 12/22/2010  . General medical examination 12/22/2010  . LOM 01/26/2010     Kipp Laurence, PT, DPT 02/03/17 10:58 AM   [facility] [address]  [address] [place], [place], [address] Phone: [phone]   Fax:  [phone]  Name: DIJONNA GERGES MRN: [id number] Date of Birth: 09-17-1990

## 2017-02-07 ENCOUNTER — Telehealth: Payer: Self-pay | Admitting: *Deleted

## 2017-02-07 ENCOUNTER — Ambulatory Visit: Payer: Managed Care, Other (non HMO) | Admitting: Physical Therapy

## 2017-02-07 DIAGNOSIS — M546 Pain in thoracic spine: Secondary | ICD-10-CM

## 2017-02-07 DIAGNOSIS — M5124 Other intervertebral disc displacement, thoracic region: Secondary | ICD-10-CM

## 2017-02-07 DIAGNOSIS — M5134 Other intervertebral disc degeneration, thoracic region: Secondary | ICD-10-CM

## 2017-02-07 DIAGNOSIS — R29898 Other symptoms and signs involving the musculoskeletal system: Secondary | ICD-10-CM

## 2017-02-07 NOTE — Telephone Encounter (Signed)
Pt came in to office stating she had not heard back from [place] about MRI thoracic spine she had recently at [facility]. She brings disc with her today but report is not on disc. Called and requested report. Received faxed and placed in PCP yellow folder for review. Pt states she has been doing PT x 3 weeks but not having any improvement. Please review result and advise? Pt requests response via mychart.

## 2017-02-07 NOTE — Therapy (Signed)
[facility] [address]  [address] [place], [place], [address] Phone: [phone]   Fax:  [phone]  Physical Therapy Treatment  Patient Details  Name: Meredith [name] MRN: [id number] Date of Birth: 05-24-1991 Referring Provider: Sandford Craze  Encounter Date: 02/07/2017      PT End of Session - 02/07/17 1531    Visit Number 4   Number of Visits 12   PT Start Time 1451   PT Stop Time 1531   PT Time Calculation (min) 40 min   Activity Tolerance Patient tolerated treatment well   Behavior During Therapy [facility] for tasks assessed/performed      Past Medical History:  Diagnosis Date  . Hypoglycemia     No past surgical history on file.  There were no vitals filed for this visit.      Subjective Assessment - 02/07/17 1454    Subjective feels like pain is different (getting better)   Diagnostic tests Xray: normal; MRI: unknown currently   Patient Stated Goals improve pain   Currently in Pain? No/denies   Pain Score 0-No pain                         [facility] Adult PT Treatment/Exercise - 02/07/17 0001      Shoulder Exercises: Supine   Other Supine Exercises serratus punches - 5# B UE x 20 reps     Shoulder Exercises: Sidelying   Other Sidelying Exercises open book - 3 x 30 sec each side     Shoulder Exercises: ROM/Strengthening   UBE (Upper Arm Bike) L3 x 6 min (3/3)   Cybex Row 15 reps   Cybex Row Limitations 25# - narrow grip - 5 sec hold   Other ROM/Strengthening Exercises BATCA pull down - 20# x 15 reps     Manual Therapy   Manual Therapy Soft tissue mobilization   Manual therapy comments patient prone   Soft tissue mobilization STM to R sided paraspinals with green ball          Trigger Point Dry Needling - 02/07/17 1814    Consent Given? Yes   Muscles Treated Upper Body Longissimus   Longissimus Response Twitch response elicited;Palpable increased muscle length                 PT Short Term Goals - 01/31/17 1538      PT SHORT TERM GOAL #1   Title patient to be independent with initial HEP   Status On-going           PT Long Term Goals - 01/31/17 1539      PT LONG TERM GOAL #1   Title patient to be independent with advanced HEP   Status On-going     PT LONG TERM GOAL #2   Title patient to report pain reduction at R sided thoracic by >/= 50%   Status On-going     PT LONG TERM GOAL #3   Title patient to report ability to perform work related tasks including prolonged sign language and sitting with no increase in pain   Status On-going     PT LONG TERM GOAL #4   Title patient to report ability to participate in gym regimen with no increase in pain follwoing   Status On-going               Plan - 02/07/17 1609    Clinical Impression Statement Patient today with  continued reports of pain, but does feel like it is improving since initial visit. Some DN continued today with continued good response, however, little tightness resolved following DN. Able to perform all stretching and periscapular strengthening with no issue.    PT Treatment/Interventions ADLs/Self Care Home Management;Cryotherapy;Electrical Stimulation;Moist Heat;Traction;Ultrasound;Therapeutic exercise;Therapeutic activities;Patient/family education;Manual techniques;Passive range of motion;Vasopneumatic Device;Taping;Dry needling   Consulted and Agree with Plan of Care Patient      Patient will benefit from skilled therapeutic intervention in order to improve the following deficits and impairments:  Pain, Decreased activity tolerance  Visit Diagnosis: Pain in thoracic spine  Other symptoms and signs involving the musculoskeletal system     Problem List Patient Active Problem List   Diagnosis Date Noted  . Fever 12/22/2010  . General medical examination 12/22/2010  . LOM 01/26/2010      Meredith [name], PT, DPT 02/07/17 6:18 PM   [facility] Outpatient Rehabilitation [facility] [address]  [address] [place], [place], [address] Phone: [phone]   Fax:  [phone]  Name: Meredith [name] MRN: [id number] Date of Birth: 1990/06/28

## 2017-02-08 NOTE — Telephone Encounter (Signed)
Reviewed MRI results.  I apologize for delay- this report was never sent to me.  MRI shows mild bulging disc at T6-7.  I will place referral to neurosurgery- however I suspect that they will recommend physical therapy as well.

## 2017-02-08 NOTE — Telephone Encounter (Signed)
Notified pt and she is agreeable to proceed with referral. Referral signed. 

## 2017-02-08 NOTE — Telephone Encounter (Signed)
Left message for pt to return my call.

## 2017-02-09 ENCOUNTER — Ambulatory Visit: Payer: Managed Care, Other (non HMO) | Admitting: Physical Therapy

## 2017-02-09 DIAGNOSIS — R29898 Other symptoms and signs involving the musculoskeletal system: Secondary | ICD-10-CM

## 2017-02-09 DIAGNOSIS — M546 Pain in thoracic spine: Secondary | ICD-10-CM

## 2017-02-09 NOTE — Therapy (Signed)
[facility] [place] [address]  [address] [place], [place], [address] Phone: [phone]   Fax:  [phone]  Physical Therapy Treatment  Patient Details  Name: Verner CholRachel K Ghrist MRN: [id number] Date of Birth: 06-09-1991 Referring Provider: Sandford CrazeMelissa O'Sullivan  Encounter Date: 02/09/2017      PT End of Session - 02/09/17 1539    Visit Number 5   Number of Visits 12   PT Start Time 1535   PT Stop Time 1614   PT Time Calculation (min) 39 min   Activity Tolerance Patient tolerated treatment well   Behavior During Therapy [facility] for tasks assessed/performed      Past Medical History:  Diagnosis Date  . Hypoglycemia     No past surgical history on file.  There were no vitals filed for this visit.      Subjective Assessment - 02/09/17 1538    Subjective got MRI results - being referred to neurosurgery   Diagnostic tests Xray: normal; MRI: mild disc bulge at T6-7   Patient Stated Goals improve pain   Currently in Pain? No/denies   Pain Score 0-No pain  pain with lying down and not sitting with erect posture                         [facility] Adult PT Treatment/Exercise - 02/09/17 1542      Exercises   Exercises Neck     Neck Exercises: Standing   Upper Extremity D1 Flexion;Extension;15 reps;Theraband   Theraband Level (UE D1) Level 2 (Red)   Upper Extremity D2 Flexion;Extension;15 reps;Theraband   Theraband Level (UE D2) Level 2 (Red)     Shoulder Exercises: ROM/Strengthening   UBE (Upper Arm Bike) L3 x 6 min (3/3)   Cybex Row 15 reps   Cybex Row Limitations 25# - narrow grip - 5 sec hold   Wall Pushups 15 reps   Pushups Limitations on orange pball   Other ROM/Strengthening Exercises BATCA serratus punch  - 20# x 15 reps     Shoulder Exercises: Stretch   Other Shoulder Stretches R lat stretch in doorway - 4 x 30 seconds     Manual Therapy   Manual therapy comments self massage with ball against wall                   PT Short Term Goals - 01/31/17 1538      PT SHORT TERM GOAL #1   Title patient to be independent with initial HEP   Status On-going           PT Long Term Goals - 01/31/17 1539      PT LONG TERM GOAL #1   Title patient to be independent with advanced HEP   Status On-going     PT LONG TERM GOAL #2   Title patient to report pain reduction at R sided thoracic by >/= 50%   Status On-going     PT LONG TERM GOAL #3   Title patient to report ability to perform work related tasks including prolonged sign language and sitting with no increase in pain   Status On-going     PT LONG TERM GOAL #4   Title patient to report ability to participate in gym regimen with no increase in pain follwoing   Status On-going               Plan - 02/09/17 1625    Clinical Impression  Statement Patient receiving MRI results: Small central disc protrusion at T6-7 without central canal or foraminal stenosis; The study is otherwise normal. Patient noticing no real difference in pain patterns. Discussion of current exercise regimen with education to modify "upward dog" as well as to refrain from cruches for hopeful reduction in stressors placed through spine. Able to perform all activiites within session with no real reproduction of pain, however, some shoulder sorenss from prior injuries.    PT Treatment/Interventions ADLs/Self Care Home Management;Cryotherapy;Electrical Stimulation;Moist Heat;Traction;Ultrasound;Therapeutic exercise;Therapeutic activities;Patient/family education;Manual techniques;Passive range of motion;Vasopneumatic Device;Taping;Dry needling   Consulted and Agree with Plan of Care Patient      Patient will benefit from skilled therapeutic intervention in order to improve the following deficits and impairments:  Pain, Decreased activity tolerance  Visit Diagnosis: Pain in thoracic spine  Other symptoms and signs involving the musculoskeletal  system     Problem List Patient Active Problem List   Diagnosis Date Noted  . Fever 12/22/2010  . General medical examination 12/22/2010  . LOM 01/26/2010     Kipp Laurence, PT, DPT 02/09/17 4:31 PM   [facility] [address]  [address] [place], [place], [address] Phone: [phone]   Fax:  [phone]  Name: GERALINE HALBERSTADT MRN: [id number] Date of Birth: 1990/11/18

## 2017-02-16 ENCOUNTER — Ambulatory Visit: Payer: Managed Care, Other (non HMO) | Attending: Family | Admitting: Physical Therapy

## 2017-02-16 DIAGNOSIS — R29898 Other symptoms and signs involving the musculoskeletal system: Secondary | ICD-10-CM | POA: Insufficient documentation

## 2017-02-16 DIAGNOSIS — M546 Pain in thoracic spine: Secondary | ICD-10-CM | POA: Diagnosis present

## 2017-02-16 NOTE — Therapy (Signed)
[facility] [address]  [address] [place], [place], [address] Phone: [phone]   Fax:  [phone]  Physical Therapy Treatment  Patient Details  Name: Meredith [name] MRN: [id number] Date of Birth: 1991-05-30 Referring Provider: Sandford Craze  Encounter Date: 02/16/2017      PT End of Session - 02/16/17 1530    Visit Number 6   Number of Visits 12   PT Start Time 1448   PT Stop Time 1529   PT Time Calculation (min) 41 min   Activity Tolerance Patient tolerated treatment well   Behavior During Therapy [facility] for tasks assessed/performed      Past Medical History:  Diagnosis Date  . Hypoglycemia     No past surgical history on file.  There were no vitals filed for this visit.      Subjective Assessment - 02/16/17 1449    Subjective hasn't heard from neutrosurgery - thinks back may be a little better   Diagnostic tests Xray: normal; MRI: mild disc bulge at T6-7   Patient Stated Goals improve pain   Currently in Pain? No/denies   Pain Score 0-No pain                         [facility] Adult PT Treatment/Exercise - 02/16/17 0001      Shoulder Exercises: Supine   Horizontal ABduction Strengthening;Both;15 reps;Theraband   Theraband Level (Shoulder Horizontal ABduction) Level 2 (Red)   Horizontal ABduction Limitations hooklying on foam roll   External Rotation Strengthening;Both;15 reps;Theraband   Theraband Level (Shoulder External Rotation) Level 2 (Red)   External Rotation Limitations hooklying on foam roll   Flexion Right;15 reps;Theraband   Theraband Level (Shoulder Flexion) Level 2 (Red)   Flexion Limitations hooklying on foam roll     Shoulder Exercises: Prone   Other Prone Exercises childs pose with L lateral lean to stretch R side 2 x 30 sec     Shoulder Exercises: Standing   Other Standing Exercises standing bent over row - R UE only - 5#     Shoulder Exercises: ROM/Strengthening    UBE (Upper Arm Bike) L3.5 x 6 min (3/3)   Cybex Row 15 reps   Cybex Row Limitations 35# - narrow grip - 5 sec hold   Other ROM/Strengthening Exercises BATCA serratus punch  - 25# x 15 reps                  PT Short Term Goals - 01/31/17 1538      PT SHORT TERM GOAL #1   Title patient to be independent with initial HEP   Status On-going           PT Long Term Goals - 01/31/17 1539      PT LONG TERM GOAL #1   Title patient to be independent with advanced HEP   Status On-going     PT LONG TERM GOAL #2   Title patient to report pain reduction at R sided thoracic by >/= 50%   Status On-going     PT LONG TERM GOAL #3   Title patient to report ability to perform work related tasks including prolonged sign language and sitting with no increase in pain   Status On-going     PT LONG TERM GOAL #4   Title patient to report ability to participate in gym regimen with no increase in pain follwoing   Status On-going  Plan - 02/16/17 1642    Clinical Impression Statement Patient noticing some slight improvements in pain patterns, however, pain does continue during rest/sleeping interfering with restful sleep. Has not yet heard from neurologist. Continuing to work on postural re-ed as well as scapular and mid back musculature to patient tolerance.    PT Treatment/Interventions ADLs/Self Care Home Management;Cryotherapy;Electrical Stimulation;Moist Heat;Traction;Ultrasound;Therapeutic exercise;Therapeutic activities;Patient/family education;Manual techniques;Passive range of motion;Vasopneumatic Device;Taping;Dry needling   Consulted and Agree with Plan of Care Patient      Patient will benefit from skilled therapeutic intervention in order to improve the following deficits and impairments:  Pain, Decreased activity tolerance  Visit Diagnosis: Pain in thoracic spine  Other symptoms and signs involving the musculoskeletal system     Problem  List Patient Active Problem List   Diagnosis Date Noted  . Fever 12/22/2010  . General medical examination 12/22/2010  . LOM 01/26/2010     Kipp LaurenceStephanie R Avenly Roberge, PT, DPT 02/16/17 4:44 PM   [facility] [place] [address]  [address] [place], [place], [address] Phone: [phone]   Fax:  [phone]  Name: Verner CholRachel K Schimming MRN: [id number] Date of Birth: 12/16/1990

## 2017-02-17 ENCOUNTER — Ambulatory Visit: Payer: Managed Care, Other (non HMO)

## 2017-02-17 DIAGNOSIS — M546 Pain in thoracic spine: Secondary | ICD-10-CM | POA: Diagnosis not present

## 2017-02-17 DIAGNOSIS — R29898 Other symptoms and signs involving the musculoskeletal system: Secondary | ICD-10-CM

## 2017-02-17 NOTE — Therapy (Signed)
[facility] [place] [address]  [address] [place], [place], [address] Phone: [phone]   Fax:  [phone]  Physical Therapy Treatment  Patient Details  Name: Meredith CholRachel K [name] MRN: [id number] Date of Birth: January 21, 1991 Referring Provider: Sandford CrazeMelissa O'Sullivan  Encounter Date: 02/17/2017      PT End of Session - 02/17/17 0937    Visit Number 7   Number of Visits 12   PT Start Time 0930   PT Stop Time 1025   PT Time Calculation (min) 55 min   Activity Tolerance Patient tolerated treatment well   Behavior During Therapy [facility] for tasks assessed/performed      Past Medical History:  Diagnosis Date  . Hypoglycemia     No past surgical history on file.  There were no vitals filed for this visit.      Subjective Assessment - 02/17/17 0931    Subjective Pt. noting stomach sleeping position still most comfortable.  Driving bothers mid back.  Pt. noting she is participating in boot camp at [employer] 3x/wk for general conditioning.       Patient Stated Goals improve pain   Currently in Pain? Yes   Pain Score 5    Pain Location Thoracic   Pain Orientation Right;Mid   Pain Descriptors / Indicators Tightness   Pain Type Chronic pain   Pain Onset More than a month ago   Pain Frequency Intermittent   Aggravating Factors  unable to identify aggravating factors    Pain Relieving Factors position change   Multiple Pain Sites No                         [facility] Adult PT Treatment/Exercise - 02/17/17 0943      Lumbar Exercises: Sidelying   Other Sidelying Lumbar Exercises B sidelying Open Book stretch 5" x 5 reps      Lumbar Exercises: Quadruped   Other Quadruped Lumbar Exercises B "thread the needle" in quadruped 5" x 5 reps each side     Shoulder Exercises: Supine   Horizontal ABduction Strengthening;Both;15 reps;Theraband   Theraband Level (Shoulder Horizontal ABduction) Level 3 (Green)   Horizontal ABduction  Limitations hooklying on 1/2 foam roll   External Rotation Strengthening;Both;15 reps;Theraband   Theraband Level (Shoulder External Rotation) Level 2 (Red)   External Rotation Limitations hooklying on foam roll     Shoulder Exercises: ROM/Strengthening   UBE (Upper Arm Bike) L3.5 x 6 min (3/3)   Wall Pushups 20 reps   Pushups Limitations on orange pball     Shoulder Exercises: Stretch   Other Shoulder Stretches childs pose + L lean (for lats) stretch x 30 sec    Other Shoulder Stretches Hooklying chest stretch x 1 min on 1/2 foam bolster     Modalities   Modalities Moist Heat     Moist Heat Therapy   Number Minutes Moist Heat 10 Minutes   Moist Heat Location --  mid back      Manual Therapy   Manual Therapy Soft tissue mobilization   Manual therapy comments pt. prone    Soft tissue mobilization STM to R sided thoracic paraspinals; pt. noting "this area seems smaller today" refering to tenderness in R thoracic area                  PT Short Term Goals - 02/17/17 1237      PT SHORT TERM GOAL #1   Title patient  to be independent with initial HEP   Status Achieved           PT Long Term Goals - 01/31/17 1539      PT LONG TERM GOAL #1   Title patient to be independent with advanced HEP   Status On-going     PT LONG TERM GOAL #2   Title patient to report pain reduction at R sided thoracic by >/= 50%   Status On-going     PT LONG TERM GOAL #3   Title patient to report ability to perform work related tasks including prolonged sign language and sitting with no increase in pain   Status On-going     PT LONG TERM GOAL #4   Title patient to report ability to participate in gym regimen with no increase in pain follwoing   Status On-going               Plan - 02/17/17 1610    Clinical Impression Statement Pt. doing well today however noting some "tightness" in mid back.  Did participate in "Boot camp" group exercise class last night at local Gym, which  may be contributing to "tightness".  Tolerated all postural training, and scapular strengthening activities well today.  Nearly pain free throughout therex.  Notes "tender" area was "smaller" with STM and good tolerance for deep pressure.  Moist heat applied to mid back to end treatment to lessen post-exercise tightness/soreness.     PT Treatment/Interventions ADLs/Self Care Home Management;Cryotherapy;Electrical Stimulation;Moist Heat;Traction;Ultrasound;Therapeutic exercise;Therapeutic activities;Patient/family education;Manual techniques;Passive range of motion;Vasopneumatic Device;Taping;Dry needling      Patient will benefit from skilled therapeutic intervention in order to improve the following deficits and impairments:  Pain, Decreased activity tolerance  Visit Diagnosis: Pain in thoracic spine  Other symptoms and signs involving the musculoskeletal system     Problem List Patient Active Problem List   Diagnosis Date Noted  . Fever 12/22/2010  . General medical examination 12/22/2010  . LOM 01/26/2010    Kermit Balo, PTA 02/17/17 12:38 PM  [facility] [address]  [address] [place], [place], [address] Phone: [phone]   Fax:  [phone]  Name: Meredith [name] MRN: [id number] Date of Birth: May 01, 1991

## 2017-02-21 ENCOUNTER — Ambulatory Visit: Payer: Managed Care, Other (non HMO)

## 2017-02-21 DIAGNOSIS — M546 Pain in thoracic spine: Secondary | ICD-10-CM | POA: Diagnosis not present

## 2017-02-21 DIAGNOSIS — R29898 Other symptoms and signs involving the musculoskeletal system: Secondary | ICD-10-CM

## 2017-02-21 NOTE — Therapy (Signed)
[facility] [address]  [address] [place], [place], [address] Phone: [phone]   Fax:  [phone]  Physical Therapy Treatment  Patient Details  Name: Meredith [name] MRN: [id number] Date of Birth: September 12, 1990 Referring Provider: Sandford Craze  Encounter Date: 02/21/2017      PT End of Session - 02/21/17 1505    Visit Number 8   Number of Visits 12   PT Start Time 1447   PT Stop Time 1545   PT Time Calculation (min) 58 min   Activity Tolerance Patient tolerated treatment well   Behavior During Therapy [facility] for tasks assessed/performed      Past Medical History:  Diagnosis Date  . Hypoglycemia     No past surgical history on file.  There were no vitals filed for this visit.      Subjective Assessment - 02/21/17 1449    Subjective Pt. noting somewhat increased pain over last few days.  Noting intermittent pain on L mid back now.  [employer] group exercise class night after last treatment.  Notes exercise class "bootcamp" is "intense" right now.     Patient Stated Goals improve pain   Currently in Pain? Yes   Pain Score 1   average today 4/10 mid back pain B    Pain Location Thoracic   Pain Orientation Right;Mid   Pain Descriptors / Indicators Tightness   Pain Type Chronic pain   Pain Onset More than a month ago   Pain Frequency Intermittent                         [facility] Adult PT Treatment/Exercise - 02/21/17 1502      Self-Care   Self-Care Other Self-Care Comments   Other Self-Care Comments  Foam roller to mid back on mat table 2 x 1 min      Neck Exercises: Seated   Other Seated Exercise Rhomboids stretch 2 x 30 sec      Lumbar Exercises: Sidelying   Other Sidelying Lumbar Exercises B sidelying Open Book stretch 5" x 5 reps      Lumbar Exercises: Quadruped   Other Quadruped Lumbar Exercises B "thread the needle" in quadruped 5" x 5 reps each side     Shoulder Exercises:  ROM/Strengthening   UBE (Upper Arm Bike) L3.5 x 6 min (3/3)     Modalities   Modalities Electrical Stimulation     Moist Heat Therapy   Number Minutes Moist Heat 15 Minutes   Moist Heat Location --  mid back      Electrical Stimulation   Electrical Stimulation Location thoracic paraspinals    Electrical Stimulation Action IFC   Electrical Stimulation Parameters 80-150Hz , intensity to pt. tolerance, 15'    Electrical Stimulation Goals Pain;Tone     Manual Therapy   Manual Therapy Soft tissue mobilization   Manual therapy comments pt. prone    Soft tissue mobilization STM to R sided thoracic paraspinals to decrease tone                  PT Short Term Goals - 02/17/17 1237      PT SHORT TERM GOAL #1   Title patient to be independent with initial HEP   Status Achieved           PT Long Term Goals - 02/21/17 1517      PT LONG TERM GOAL #1   Title patient to be independent  with advanced HEP   Status On-going     PT LONG TERM GOAL #2   Title patient to report pain reduction at R sided thoracic by >/= 50%   Status On-going     PT LONG TERM GOAL #3   Title patient to report ability to perform work related tasks including prolonged sign language and sitting with no increase in pain   Status On-going  Notes able to perform work related tasks without increase in pain      PT LONG TERM GOAL #4   Title patient to report ability to participate in gym regimen with no increase in pain follwoing   Status On-going               Plan - 02/21/17 1511    Clinical Impression Statement Pt. noting she has had some pain last few days intermittently on L mid back without known trigger.  Reporting local gym group class (boot camp) has been "intense" recently.  Treatment focusing on thoracic stretching/ROM with massage to tender areas to decrease tone in mid back.  Treatment ending with E-stim/moist heat to mid back to further relax tight musculature.  Ended treatment  pain free.      PT Treatment/Interventions ADLs/Self Care Home Management;Cryotherapy;Electrical Stimulation;Moist Heat;Traction;Ultrasound;Therapeutic exercise;Therapeutic activities;Patient/family education;Manual techniques;Passive range of motion;Vasopneumatic Device;Taping;Dry needling      Patient will benefit from skilled therapeutic intervention in order to improve the following deficits and impairments:  Pain, Decreased activity tolerance  Visit Diagnosis: Pain in thoracic spine  Other symptoms and signs involving the musculoskeletal system     Problem List Patient Active Problem List   Diagnosis Date Noted  . Fever 12/22/2010  . General medical examination 12/22/2010  . LOM 01/26/2010    Kermit BaloMicah Cartha Rotert, PTA 02/21/17 6:22 PM  [facility] [place] [address]  [address] [place], [place], [address] Phone: [phone]   Fax:  [phone]  Name: Meredith [name] MRN: [id number] Date of Birth: 1991-05-22

## 2017-02-23 ENCOUNTER — Ambulatory Visit: Payer: Managed Care, Other (non HMO) | Admitting: Physical Therapy

## 2017-02-23 DIAGNOSIS — R29898 Other symptoms and signs involving the musculoskeletal system: Secondary | ICD-10-CM

## 2017-02-23 DIAGNOSIS — M546 Pain in thoracic spine: Secondary | ICD-10-CM

## 2017-02-23 NOTE — Therapy (Signed)
[facility] [place] [address]  [address] [place], [place], [address] Phone: [phone]   Fax:  [phone]  Physical Therapy Treatment  Patient Details  Name: Meredith CholRachel K [name] MRN: [id number] Date of Birth: [birth year] Referring Provider: Sandford CrazeMelissa O'Sullivan  Encounter Date: 02/23/2017      PT End of Session - 02/23/17 1452    Visit Number 9   Number of Visits 12   PT Start Time 1449   PT Stop Time 1520   PT Time Calculation (min) 31 min   Activity Tolerance Patient tolerated treatment well   Behavior During Therapy [facility] for tasks assessed/performed      Past Medical History:  Diagnosis Date  . Hypoglycemia     No past surgical history on file.  There were no vitals filed for this visit.      Subjective Assessment - 02/23/17 1450    Subjective patient with continued pain at L thoracic spine - only has relief of pain with exercise (boot camp)   Diagnostic tests Xray: normal; MRI: mild disc bulge at T6-7   Patient Stated Goals improve pain   Currently in Pain? Yes   Pain Score 7    Pain Location Back  thoracic   Pain Orientation Right;Left   Pain Descriptors / Indicators Aching;Discomfort   Pain Type Chronic pain                         [facility] Adult PT Treatment/Exercise - 02/23/17 1453      Shoulder Exercises: ROM/Strengthening   UBE (Upper Arm Bike) L3.5 x 6 min (3/3)     Manual Therapy   Manual Therapy Soft tissue mobilization;Myofascial release;Taping   Manual therapy comments pt. prone    Soft tissue mobilization deep STM and manual trigger point release to R and L sided thoracic paraspinals   Kinesiotex Inhibit Muscle     Kinesiotix   Inhibit Muscle  2 "I: strips to paraspinals, horizontal strip at approx T4 and T8                  PT Short Term Goals - 02/17/17 1237      PT SHORT TERM GOAL #1   Title patient to be independent with initial HEP   Status Achieved            PT Long Term Goals - 02/21/17 1517      PT LONG TERM GOAL #1   Title patient to be independent with advanced HEP   Status On-going     PT LONG TERM GOAL #2   Title patient to report pain reduction at R sided thoracic by >/= 50%   Status On-going     PT LONG TERM GOAL #3   Title patient to report ability to perform work related tasks including prolonged sign language and sitting with no increase in pain   Status On-going  Notes able to perform work related tasks without increase in pain      PT LONG TERM GOAL #4   Title patient to report ability to participate in gym regimen with no increase in pain follwoing   Status On-going               Plan - 02/23/17 1453    Clinical Impression Statement Patient continuing to have mid back pain, now R and L sided. PT session today focusing on manual therapy as well as taping for hopeful painrelief. NO  exercises performed today, in the event that is root for flare up in pain. Discussion with patient regarding relaxation techniques as well as managing stress as this may also be contibutory to pain.    PT Treatment/Interventions ADLs/Self Care Home Management;Cryotherapy;Electrical Stimulation;Moist Heat;Traction;Ultrasound;Therapeutic exercise;Therapeutic activities;Patient/family education;Manual techniques;Passive range of motion;Vasopneumatic Device;Taping;Dry needling   Consulted and Agree with Plan of Care Patient      Patient will benefit from skilled therapeutic intervention in order to improve the following deficits and impairments:  Pain, Decreased activity tolerance  Visit Diagnosis: Pain in thoracic spine  Other symptoms and signs involving the musculoskeletal system     Problem List Patient Active Problem List   Diagnosis Date Noted  . Fever 12/22/2010  . General medical examination 12/22/2010  . LOM 01/26/2010    Kipp Laurence, PT, DPT 02/23/17 4:24 PM   [facility]  [facility] [address]  [address] [place], [place], [address] Phone: [phone]   Fax:  [phone]  Name: Meredith [name] MRN: [id number] Date of Birth: 24-Jul-1990

## 2017-02-28 ENCOUNTER — Ambulatory Visit: Payer: Managed Care, Other (non HMO) | Admitting: Physical Therapy

## 2017-02-28 DIAGNOSIS — R29898 Other symptoms and signs involving the musculoskeletal system: Secondary | ICD-10-CM

## 2017-02-28 DIAGNOSIS — M546 Pain in thoracic spine: Secondary | ICD-10-CM

## 2017-02-28 NOTE — Therapy (Addendum)
[facility] [address]  [place], [place], [address] Phone: [phone]   Fax:  [phone]  Physical Therapy Treatment  Patient Details  Name: Meredith [name] MRN: [id number] Date of Birth: February 29, 1991 Referring Provider: Debbrah Alar  Encounter Date: 02/28/2017      PT End of Session - 02/28/17 1806    Visit Number 10   Number of Visits 12   PT Start Time 5427   PT Stop Time 0623   PT Time Calculation (min) 41 min   Activity Tolerance Patient tolerated treatment well   Behavior During Therapy [facility] for tasks assessed/performed      Past Medical History:  Diagnosis Date  . Hypoglycemia     No past surgical history on file.  There were no vitals filed for this visit.      Subjective Assessment - 02/28/17 1542    Subjective feels like taping may have helped a little. L sided thoracic hurts pretty constant now; used heating pad prior to coming in   Diagnostic tests Xray: normal; MRI: mild disc bulge at T6-7   Patient Stated Goals improve pain   Currently in Pain? Yes   Pain Score 1    Pain Location Back  thoracic   Pain Orientation Right;Left   Pain Descriptors / Indicators Aching;Sore                         [facility] Adult PT Treatment/Exercise - 02/28/17 0001      Shoulder Exercises: ROM/Strengthening   UBE (Upper Arm Bike) L3.5 x 6 min (3/3)     Manual Therapy   Manual Therapy Joint mobilization;Soft tissue mobilization;Taping   Manual therapy comments pt. prone    Joint Mobilization CPAs throughout spine - good mobility   Soft tissue mobilization STM to B thoracic paraspinals   Kinesiotex Inhibit Muscle     Kinesiotix   Inhibit Muscle  2 "I: strips to paraspinals, horizontal strip at approx T4 and T8     Neck Exercises: Stretches   Other Neck Stretches self mobilizations with foam roller    Other Neck Stretches prone press ups 3 x 60 sec                   PT Short Term Goals - 02/17/17 1237      PT SHORT TERM GOAL #1   Title patient to be independent with initial HEP   Status Achieved           PT Long Term Goals - 02/21/17 1517      PT LONG TERM GOAL #1   Title patient to be independent with advanced HEP   Status On-going     PT LONG TERM GOAL #2   Title patient to report pain reduction at R sided thoracic by >/= 50%   Status On-going     PT LONG TERM GOAL #3   Title patient to report ability to perform work related tasks including prolonged sign language and sitting with no increase in pain   Status On-going  Notes able to perform work related tasks without increase in pain      PT LONG TERM GOAL #4   Title patient to report ability to participate in gym regimen with no increase in pain follwoing   Status On-going               Plan - 02/28/17 1806    Clinical  Impression Statement Patient today with continued reports of B mid back pain; L sided fairly new in time since onset with no known mechanism of injury. Recently took off work and gym program for 5 days with little releif in symptoms. Also performing relaxation techniques and general stress relieving activities with little relief. Patient noting some response from taping, therefor continued today. PT recommendning folllow-up with MD as patient symtpoms are not improving at this time as well as new onset of pain. PT sessions have incorporated stretching and strengthening of mid back, DN, electrical stimulation, taping, moist heat, as well as manual therapy with liitle carryover of results. Will plan to leave chart open, however, PT preferring patient return to MD due to current pain patterns.    PT Treatment/Interventions ADLs/Self Care Home Management;Cryotherapy;Electrical Stimulation;Moist Heat;Traction;Ultrasound;Therapeutic exercise;Therapeutic activities;Patient/family education;Manual techniques;Passive range of motion;Vasopneumatic Device;Taping;Dry needling    Consulted and Agree with Plan of Care Patient      Patient will benefit from skilled therapeutic intervention in order to improve the following deficits and impairments:  Pain, Decreased activity tolerance  Visit Diagnosis: Pain in thoracic spine  Other symptoms and signs involving the musculoskeletal system     Problem List Patient Active Problem List   Diagnosis Date Noted  . Fever 12/22/2010  . General medical examination 12/22/2010  . LOM 01/26/2010     Lanney Gins, PT, DPT 02/28/17 6:11 PM  PHYSICAL THERAPY DISCHARGE SUMMARY  Visits from Start of Care: 10  Current functional level related to goals / functional outcomes: See above   Remaining deficits: See above; seemingly worsening back pain. PT sending note to referring MD with patient returning for follow-up per chart review. MD recommending being seen by neurosurgery for continued work-up with PT placed on hold.    Education / Equipment: HEP  Plan: Patient agrees to discharge.  Patient goals were not met. Patient is being discharged due to the physician's request.  ?????    Lanney Gins, PT, DPT 03/28/17 8:30 AM   [facility] [address]  [place], [place], [address] Phone: [phone]   Fax:  [phone]  Name: Meredith [name] MRN: [id number] Date of Birth: 05-09-1991

## 2017-03-03 ENCOUNTER — Ambulatory Visit (INDEPENDENT_AMBULATORY_CARE_PROVIDER_SITE_OTHER): Payer: Managed Care, Other (non HMO) | Admitting: Family

## 2017-03-03 ENCOUNTER — Encounter: Payer: Self-pay | Admitting: Family

## 2017-03-03 VITALS — BP 102/62 | HR 85 | Temp 98.2°F | Ht 65.0 in | Wt 198.0 lb

## 2017-03-03 DIAGNOSIS — G8929 Other chronic pain: Secondary | ICD-10-CM | POA: Diagnosis not present

## 2017-03-03 DIAGNOSIS — Z23 Encounter for immunization: Secondary | ICD-10-CM | POA: Diagnosis not present

## 2017-03-03 DIAGNOSIS — M546 Pain in thoracic spine: Secondary | ICD-10-CM

## 2017-03-03 MED ORDER — METHYLPREDNISOLONE 4 MG PO TBPK: ORAL_TABLET | ORAL | 0 refills | Status: DC

## 2017-03-03 NOTE — Progress Notes (Signed)
Pre visit review using our clinic review tool, if applicable. No additional management support is needed unless otherwise documented below in the visit note. 

## 2017-03-03 NOTE — Patient Instructions (Signed)
Start medrol dose pak.   Please meet with neurosurgery when you are able. Call if new/worsening symptoms between now and your appointment with the neurosurgeon. Great job losing weight!

## 2017-03-03 NOTE — Progress Notes (Signed)
Subjective:    Patient ID: Meredith [name], female    DOB: [birth year], 26 y.o.   MRN: [id number]  HPI  Meredith [name] is a 26 yr old female who presents today for follow up.last visit MRI of her spine was performed due to ongoing low back pain.  It noted MRI shows mild bulging disc at T6-7.  I placed a referral to neurosurgery.  Pt reports that she has been doing PT for about 1 month and that her back pain has worsened.  She reports that back pain is located in her mid back on the left side. Pain is non-radiating.  Pain is worse walking/standing.  Having trouble getting comfortable at night due to the pain. She has been working hard on weight loss.  Her weight 1 year ago was  250 and she is now down to 198.      Review of Systems See HPI  Past Medical History:  Diagnosis Date  . Hypoglycemia      Social History   Social History  . Marital status: Single    Spouse name: N/A  . Number of children: N/A  . Years of education: N/A   Occupational History  . Not on file.   Social History Main Topics  . Smoking status: Never Smoker  . Smokeless tobacco: Never Used  . Alcohol use No  . Drug use: No  . Sexual activity: Not on file   Other Topics Concern  . Not on file   Social History Narrative  . No narrative on file    No past surgical history on file.  Family History  Problem Relation Age of Onset  . Hyperlipidemia Father     Allergies  Allergen Reactions  . Penicillins Rash    Current Outpatient Prescriptions on File Prior to Visit  Medication Sig Dispense Refill  . cyclobenzaprine (FLEXERIL) 5 MG tablet Take 1 tablet (5 mg total) by mouth at bedtime as needed for muscle spasms. For back pain 30 tablet 1  . meloxicam (MOBIC) 7.5 MG tablet Take 1 tablet (7.5 mg total) by mouth daily as needed for pain. 30 tablet 0   No current facility-administered medications on file prior to visit.     BP 102/62 (BP Location: Left Arm, Patient Position: Sitting, Cuff Size:  Large)   Pulse 85   Temp 98.2 F (36.8 C) (Oral)   Ht  (1.651 m)   Wt 198 lb (89.8 kg)   SpO2 97%   BMI 32.95 kg/m       Objective:   Physical Exam  Constitutional: She is oriented to person, place, and time. She appears well-developed and well-nourished.  HENT:  Head: Normocephalic and atraumatic.  Cardiovascular: Normal rate, regular rhythm and normal heart sounds.   No murmur heard. Pulmonary/Chest: Effort normal and breath sounds normal. No respiratory distress. She has no wheezes.  Musculoskeletal:       Cervical back: She exhibits no tenderness.       Lumbar back: She exhibits no tenderness.  Neurological: She is alert and oriented to person, place, and time.  Psychiatric: She has a normal mood and affect. Her behavior is normal. Judgment and thought content normal.          Assessment & Plan:  Thoracic back pain- uncontrolled.  Advised pt not to resume PT. It has already been placed on hold. Will rx with medrol dose pak.  She will schedule with neurosurgery.  She is advised to call if new/worsening  symptoms. I commended her on her weight loss.

## 2017-05-10 NOTE — Progress Notes (Signed)
[facility] [address], [address] [place], [place] [address] [phone] [phone]  Date:  05/11/2017   Name:  Meredith CholRachel K [name]   DOB:  06-13-1991   MRN:  [id number]  PCP:  Sandford Craze'Sullivan, Melissa, NP    Chief Complaint: Knee Pain (c/o left knee pain x 1-2 months. )   History of Present Illness:  Meredith CholRachel K [name] is a 26 y.o. very pleasant female patient who presents with the following:  Pt of Melissa, here today with concern of a knee problem I saw her about a year ago  She has been exercising a lot over the last year and lost a lot of weight- about 70 lbs overall!   Wt Readings from Last 3 Encounters:  05/11/17 190 lb 12.8 oz (86.5 kg)  03/03/17 198 lb (89.8 kg)  01/04/17 208 lb 9.6 oz (94.6 kg)   She notes that her left knee has been hurting for a month or so. Started gradually- no acute injury noted.  It is more painful to go down stairs  She is not able to do squats or lunges because they hurt!  No clicking or popping, no instability, she is not sure about swelling, has not really noted any swelling  Both her parents have arthritis and knee issues- her mother had knee surgery at age 99   LMP was last week  Patient Active Problem List   Diagnosis Date Noted  . Fever 12/22/2010  . General medical examination 12/22/2010  . LOM 01/26/2010    Past Medical History:  Diagnosis Date  . Hypoglycemia     No past surgical history on file.  Social History   Tobacco Use  . Smoking status: Never Smoker  . Smokeless tobacco: Never Used  Substance Use Topics  . Alcohol use: No  . Drug use: No    Family History  Problem Relation Age of Onset  . Hyperlipidemia Father     Allergies  Allergen Reactions  . Penicillins Rash    Medication list has been reviewed and updated.  Current Outpatient Medications on File Prior to Visit  Medication Sig Dispense Refill  . cyclobenzaprine (FLEXERIL) 5 MG tablet Take 1 tablet (5 mg  total) by mouth at bedtime as needed for muscle spasms. For back pain 30 tablet 1  . meloxicam (MOBIC) 7.5 MG tablet Take 1 tablet (7.5 mg total) by mouth daily as needed for pain. 30 tablet 0  . methylPREDNISolone (MEDROL DOSEPAK) 4 MG TBPK tablet Take per package 21 tablet 0   No current facility-administered medications on file prior to visit.     Review of Systems:  As per HPI- otherwise negative. She is otherwise feeling well No fever or chills No CP or SOB No redness or heat of the knee or leg   Physical Examination: Vitals:   05/11/17 1659  BP: 110/80  Pulse: 94  Temp: 98.4 F (36.9 C)  SpO2: 99%   Vitals:   05/11/17 1659  Weight: 190 lb 12.8 oz (86.5 kg)  Height: 5\' 4"  (1.626 m)   Body mass index is 32.75 kg/m. Ideal Body Weight: Weight in (lb) to have BMI = 25: 145.3  GEN: WDWN, NAD, Non-toxic, A & O x 3, overweight, looks well HEENT: Atraumatic, Normocephalic. Neck supple. No masses, No LAD. Ears and Nose: No external deformity. CV: RRR, No M/G/R. No JVD. No thrill. No extra heart sounds. PULM: CTA B, no wheezes, crackles, rhonchi. No retractions.  No resp. distress. No accessory muscle use. EXTR: No c/c/e NEURO Normal gait.  PSYCH: Normally interactive. Conversant. Not depressed or anxious appearing.  Calm demeanor.  Left knee:  Normal ROM, mild crepitus is present bilaterally. Ligaments are stable, no effusion.  Knee is not painful to palpation or to manipulation today No heat or redness noted   Assessment and Plan: Chronic pain of left knee - Plan: DG Knee Complete 4 Views Left  Here today with left knee pain for 1-2 months. Suspect patellofemoral pain, but will obtain plain films due to family history.  Assuming these are ok we went over a conservative treatment plan. If this is not helpful we can start PT Pt states understanding and agreement  Signed Abbe AmsterdamJessica Rhylan Kagel, MD

## 2017-05-11 ENCOUNTER — Ambulatory Visit (INDEPENDENT_AMBULATORY_CARE_PROVIDER_SITE_OTHER): Payer: Managed Care, Other (non HMO) | Admitting: Family Medicine

## 2017-05-11 ENCOUNTER — Ambulatory Visit (HOSPITAL_BASED_OUTPATIENT_CLINIC_OR_DEPARTMENT_OTHER)
Admission: RE | Admit: 2017-05-11 | Discharge: 2017-05-11 | Disposition: A | Discharge status: Home / Self Care | Payer: Managed Care, Other (non HMO) | Source: Ambulatory Visit | Attending: Family Medicine | Admitting: Family Medicine

## 2017-05-11 ENCOUNTER — Encounter: Payer: Self-pay | Admitting: Family Medicine

## 2017-05-11 VITALS — BP 110/80 | HR 94 | Temp 98.4°F | Ht 64.0 in | Wt 190.8 lb

## 2017-05-11 DIAGNOSIS — M25562 Pain in left knee: Secondary | ICD-10-CM

## 2017-05-11 DIAGNOSIS — G8929 Other chronic pain: Secondary | ICD-10-CM | POA: Insufficient documentation

## 2017-05-11 NOTE — Patient Instructions (Signed)
I suspect your knee pain is due to patellofemoral syndrome- a common and benign over- use injury. However, we will get plain x-rays for you today to make sure all looks well!  I will be in touch with your x-ray reports, and if nothing else appears to be wrong please follow these instructions regarding patellofemoral pain   Avoid painful exercises (squats, lunges) Warm up carefully before exercise You can use ice, elevation, NSAIDs as needed. A knee sleeve or soft support may be helpful If you are not improving in 2-3 weeks let me know  We can also start some physical therapy for you if you like

## 2017-05-12 ENCOUNTER — Encounter: Payer: Self-pay | Admitting: Family Medicine

## 2017-11-03 ENCOUNTER — Encounter: Payer: Self-pay | Admitting: Internal Medicine

## 2017-11-03 ENCOUNTER — Ambulatory Visit: Payer: PRIVATE HEALTH INSURANCE | Admitting: Internal Medicine

## 2017-11-03 VITALS — BP 116/70 | HR 76 | Temp 97.6°F | Resp 16 | Ht 64.0 in | Wt 205.1 lb

## 2017-11-03 DIAGNOSIS — M25551 Pain in right hip: Secondary | ICD-10-CM | POA: Diagnosis not present

## 2017-11-03 NOTE — Progress Notes (Signed)
Subjective:    Patient ID: Meredith [name], female    DOB: 30-Jan-1991, 27 y.o.   MRN: [id number]  DOS:  11/03/2017 Type of visit - description : acute Interval history: Her chief complaint is right hip pain. The pain is located at the right groin, minimal at rest, ++ pain sharp/severe pain if she moves "just the right way"    She also has problems with right-sided thoracic pain since last year, had physical therapy, massage therapy, dry needling and symptoms have not improved.  Review of Systems  No fever chills No injury No bulging area in the groin No rash No lower extremity tingling Denies any pain or swelling in the small joints such hands, elbows and wrists. She did notice to ticks on her right leg 2 weeks ago.  Developed no rash.  Past Medical History:  Diagnosis Date  . Hypoglycemia     History reviewed. No pertinent surgical history.  Social History   Socioeconomic History  . Marital status: Single    Spouse name: Not on file  . Number of children: Not on file  . Years of education: Not on file  . Highest education level: Not on file  Occupational History  . Not on file  Social Needs  . Financial resource strain: Not on file  . Food insecurity:    Worry: Not on file    Inability: Not on file  . Transportation needs:    Medical: Not on file    Non-medical: Not on file  Tobacco Use  . Smoking status: Never Smoker  . Smokeless tobacco: Never Used  Substance and Sexual Activity  . Alcohol use: No  . Drug use: No  . Sexual activity: Not on file  Lifestyle  . Physical activity:    Days per week: Not on file    Minutes per session: Not on file  . Stress: Not on file  Relationships  . Social connections:    Talks on phone: Not on file    Gets together: Not on file    Attends religious service: Not on file    Active member of club or organization: Not on file    Attends meetings of clubs or organizations: Not on file    Relationship status: Not on  file  . Intimate partner violence:    Fear of current or ex partner: Not on file    Emotionally abused: Not on file    Physically abused: Not on file    Forced sexual activity: Not on file  Other Topics Concern  . Not on file  Social History Narrative  . Not on file      Allergies as of 11/03/2017      Reactions   Penicillins Rash      Medication List    as of 11/03/2017 11:59 PM   You have not been prescribed any medications.        Objective:   Physical Exam BP 116/70 (BP Location: Left Arm, Patient Position: Sitting, Cuff Size: Small)   Pulse 76   Temp 97.6 F (36.4 C) (Oral)   Resp 16   Ht  (1.626 m)   Wt 205 lb 2 oz (93 kg)   SpO2 97%   BMI 35.21 kg/m  General:   Well developed, well nourished . NAD.  HEENT:  Normocephalic . Face symmetric, atraumatic Extremities: No pretibial edema bilaterally  MSK: Hands and wrists normal to inspection and palpation Hips: Rotation normal bilaterally without much  pain. Groins: Normal to palpation, no hernias. Skin: Not pale. Not jaundice Neurologic:  alert & oriented X3.  Speech normal, gait appropriate for age and unassisted Psych--  Cognition and judgment appear intact.  Cooperative with normal attention span and concentration.  Behavior appropriate. No anxious or depressed appearing.      Assessment & Plan:   27 year old female, healthy, presents with 3 days history of right hip pain.   Recommend conservative treatment with Tylenol, Motrin and rest.  I asked her to come back in 3 weeks to see PCP, she is also having back pain for several months.  Might need a referral to see a  specialist or blood work.  See AVS Doubt the symptoms related to her recent tick bite.

## 2017-11-03 NOTE — Progress Notes (Signed)
Pre visit review using our clinic review tool, if applicable. No additional management support is needed unless otherwise documented below in the visit note. 

## 2017-11-03 NOTE — Patient Instructions (Signed)
Please schedule a visit with Dr Patsy Lager in 3 weeks  IBUPROFEN (Advil or Motrin) 200 mg 2 tablets every 6 hours as needed for pain.  Always take it with food because may cause gastritis and ulcers.  If you notice nausea, stomach pain, change in the color of stools --->  Stop the medicine and let us know  Tylenol  500 mg OTC 2 tabs a day every 8 hours as needed for pain  Call if sever pain, fever, chills, rash

## 2017-11-07 ENCOUNTER — Telehealth: Payer: Self-pay | Admitting: Family Medicine

## 2018-04-29 NOTE — Progress Notes (Addendum)
[facility] [address], [address] [place], [place] [address] [phone]  Date:  04/30/2018   Name:  Meredith [name]   DOB:  1991-04-16   MRN:  [id number]  PCP:  Pearline Cables, MD    Chief Complaint: Knee Pain (seen last year same knee pain, trouble working out, left knee, right knee is starting to hurt) and Abdominal Pain (gallbladder attach 5 years ago, family hx of gallbladder issue, back pain right side, soreness)   History of Present Illness:  Meredith [name] is a 27 y.o. very pleasant female patient who presents with the following:  Following up for knee pain and "gallbaldder pain" I saw her last year with LEFT knee pain- about one year ago We did knee films at that time  She took a break from exercise but when she tried to get back into her routine the pain came back Going both up and down stairs is painful No clicking, no popping, no getting stuck She has not noted any swelling but it may feel a bit tight at times  She also notes that 5 years ago she had "a gallbladder attack" with back pain. This lasted for about an hour They thought it was due to her back  She did have an MRI of her T spine last year that was also ok  She describes a right upper back pain which has been present for at least a year, and maybe worse after eating Gallbladder issues do run in her family-she assumes this is the cause of her back pain as well  The pain is not acutely worse or different today  EXB:MWUXL had a pap Never been SA Offered to do a pap for her but she declines today  Flu: do today  Patient Active Problem List   Diagnosis Date Noted  . Fever 12/22/2010  . General medical examination 12/22/2010  . LOM 01/26/2010    Past Medical History:  Diagnosis Date  . Hypoglycemia     History reviewed. No pertinent surgical history.  Social History   Tobacco Use  . Smoking status: Never Smoker  . Smokeless tobacco:  Never Used  Substance Use Topics  . Alcohol use: No  . Drug use: No    Family History  Problem Relation Age of Onset  . Hyperlipidemia Father     Allergies  Allergen Reactions  . Penicillins Rash    Medication list has been reviewed and updated.  No current outpatient medications on file prior to visit.   No current facility-administered medications on file prior to visit.     Review of Systems:  As per HPI- otherwise negative. No fever or chills   Physical Examination: Vitals:   04/30/18 1505  BP: 126/70  Pulse: 87  Resp: 18  Temp: 98.5 F (36.9 C)  SpO2: 98%   Vitals:   04/30/18 1505  Weight: 230 lb (104.3 kg)  Height: 5\' 4"  (1.626 m)   Body mass index is 39.48 kg/m. Ideal Body Weight: Weight in (lb) to have BMI = 25: 145.3  GEN: WDWN, NAD, Non-toxic, A & O x 3, obese, otherwise looks well  HEENT: Atraumatic, Normocephalic. Neck supple. No masses, No LAD. Bilateral TM wnl, oropharynx normal.  PEERL,EOMI.   Ears and Nose: No external deformity. CV: RRR, No M/G/R. No JVD. No thrill. No extra heart sounds. PULM: CTA B, no wheezes, crackles, rhonchi. No retractions. No resp. distress. No accessory  muscle use. ABD: S,  ND, +BS. No rebound. No HSM.  She notes mild tenderness in her RUQ on exam today  EXTR: No c/c/e NEURO Normal gait.  PSYCH: Normally interactive. Conversant. Not depressed or anxious appearing.  Calm demeanor.  She indicates an area in her right mid to upper back as the location of her pain, but it is not tender.  Left knee: crepitus and some medial joint line tenderness   Assessment and Plan: RUQ pain - Plan: US Abdomen Limited RUQ, CBC, Comprehensive metabolic panel  Chronic pain of left knee - Plan: Ambulatory referral to Orthopedic Surgery  Screening for deficiency anemia - Plan: CBC  Screening for hyperlipidemia - Plan: Lipid panel  Screening for diabetes mellitus - Plan: Comprehensive metabolic panel, Hemoglobin A1c  Need for  influenza vaccination - Plan: Flu Vaccine QUAD 6+ mos PF IM (Fluarix Quad PF)  Following up today She has possible sx and exam findings of gallbladder pain, but these have been present for over a year per her report Will obtain labs today and also set up a RUQ US for her  Routine labs pending as above Offered pap but she declines Flu shot given Referral to ortho regarding her left knee She is cautioned that is any change in her abx sx- more severe pain, fever, vomiting- she should seek immediate care and she agrees    Signed Abbe AmsterdamJessica Copland, MD  Received her labs 11/19- message to pt  Results for orders placed or performed in visit on 04/30/18  CBC  Result Value Ref Range   WBC 8.8 4.0 - 10.5 K/uL   RBC 4.72 3.87 - 5.11 Mil/uL   Platelets 248.0 150.0 - 400.0 K/uL   Hemoglobin 14.2 12.0 - 15.0 g/dL   HCT 16.142.2 09.636.0 - 04.546.0 %   MCV 89.5 78.0 - 100.0 fl   MCHC 33.8 30.0 - 36.0 g/dL   RDW 40.913.6 81.111.5 - 91.415.5 %  Comprehensive metabolic panel  Result Value Ref Range   Sodium 137 135 - 145 mEq/L   Potassium 4.0 3.5 - 5.1 mEq/L   Chloride 99 96 - 112 mEq/L   CO2 27 19 - 32 mEq/L   Glucose, Bld 83 70 - 99 mg/dL   BUN 17 6 - 23 mg/dL   Creatinine, Ser 7.820.56 0.40 - 1.20 mg/dL   Total Bilirubin 0.4 0.2 - 1.2 mg/dL   Alkaline Phosphatase 48 39 - 117 U/L   AST 12 0 - 37 U/L   ALT 11 0 - 35 U/L   Total Protein 7.5 6.0 - 8.3 g/dL   Albumin 4.7 3.5 - 5.2 g/dL   Calcium 9.8 8.4 - 95.610.5 mg/dL   GFR 213.08138.27 >65.78>60.00 mL/min  Hemoglobin A1c  Result Value Ref Range   Hgb A1c MFr Bld 5.0 4.6 - 6.5 %  Lipid panel  Result Value Ref Range   Cholesterol 175 0 - 200 mg/dL   Triglycerides 46.965.0 0.0 - 149.0 mg/dL   HDL 62.9540.30 >28.41>39.00 mg/dL   VLDL 32.413.0 0.0 - 40.140.0 mg/dL   LDL Cholesterol 027121 (H) 0 - 99 mg/dL   Total CHOL/HDL Ratio 4    NonHDL 134.21

## 2018-04-30 ENCOUNTER — Ambulatory Visit (INDEPENDENT_AMBULATORY_CARE_PROVIDER_SITE_OTHER): Payer: PRIVATE HEALTH INSURANCE | Admitting: Family Medicine

## 2018-04-30 ENCOUNTER — Encounter: Payer: Self-pay | Admitting: Family Medicine

## 2018-04-30 VITALS — BP 126/70 | HR 87 | Temp 98.5°F | Resp 18 | Ht 64.0 in | Wt 230.0 lb

## 2018-04-30 DIAGNOSIS — Z131 Encounter for screening for diabetes mellitus: Secondary | ICD-10-CM | POA: Diagnosis not present

## 2018-04-30 DIAGNOSIS — R1011 Right upper quadrant pain: Secondary | ICD-10-CM

## 2018-04-30 DIAGNOSIS — Z130 Encounter for screening for diseases of the blood and blood-forming organs and certain disorders involving the immune mechanism: Secondary | ICD-10-CM | POA: Diagnosis not present

## 2018-04-30 DIAGNOSIS — M25562 Pain in left knee: Secondary | ICD-10-CM | POA: Diagnosis not present

## 2018-04-30 DIAGNOSIS — Z23 Encounter for immunization: Secondary | ICD-10-CM

## 2018-04-30 DIAGNOSIS — G8929 Other chronic pain: Secondary | ICD-10-CM

## 2018-04-30 DIAGNOSIS — Z13220 Encounter for screening for lipoid disorders: Secondary | ICD-10-CM | POA: Diagnosis not present

## 2018-04-30 NOTE — Patient Instructions (Signed)
Good to see you today- I am going to set you up for an ultrasound of your gallbladder asap Will be in touch with your ultrasound report and your labs from today We will also have you see orthopedics regarding your knee pain

## 2018-05-01 ENCOUNTER — Encounter: Payer: Self-pay | Admitting: Family Medicine

## 2018-05-01 LAB — LIPID PANEL
Cholesterol: 175 mg/dL (ref 0–200)
HDL: 40.3 mg/dL (ref >39.00)
LDL CALC: 121 mg/dL — AB (ref 0–99)
NonHDL: 134.21
Total CHOL/HDL Ratio: 4
Triglycerides: 65 mg/dL (ref 0.0–149.0)
VLDL: 13 mg/dL (ref 0.0–40.0)

## 2018-05-01 LAB — CBC
HCT: 42.2 % (ref 36.0–46.0)
HEMOGLOBIN: 14.2 g/dL (ref 12.0–15.0)
MCHC: 33.8 g/dL (ref 30.0–36.0)
MCV: 89.5 fl (ref 78.0–100.0)
PLATELETS: 248 10*3/uL (ref 150.0–400.0)
RBC: 4.72 Mil/uL (ref 3.87–5.11)
RDW: 13.6 % (ref 11.5–15.5)
WBC: 8.8 10*3/uL (ref 4.0–10.5)

## 2018-05-01 LAB — COMPREHENSIVE METABOLIC PANEL
ALBUMIN: 4.7 g/dL (ref 3.5–5.2)
ALK PHOS: 48 U/L (ref 39–117)
ALT: 11 U/L (ref 0–35)
AST: 12 U/L (ref 0–37)
BILIRUBIN TOTAL: 0.4 mg/dL (ref 0.2–1.2)
BUN: 17 mg/dL (ref 6–23)
CALCIUM: 9.8 mg/dL (ref 8.4–10.5)
CO2: 27 meq/L (ref 19–32)
CREATININE: 0.56 mg/dL (ref 0.40–1.20)
Chloride: 99 mEq/L (ref 96–112)
GFR: 138.27 mL/min (ref >60.00)
Glucose, Bld: 83 mg/dL (ref 70–99)
Potassium: 4 mEq/L (ref 3.5–5.1)
Sodium: 137 mEq/L (ref 135–145)
TOTAL PROTEIN: 7.5 g/dL (ref 6.0–8.3)

## 2018-05-01 LAB — HEMOGLOBIN A1C: HEMOGLOBIN A1C: 5 % (ref 4.6–6.5)

## 2018-05-08 ENCOUNTER — Ambulatory Visit (INDEPENDENT_AMBULATORY_CARE_PROVIDER_SITE_OTHER): Payer: No Typology Code available for payment source | Admitting: Orthopaedic Surgery

## 2018-05-08 ENCOUNTER — Ambulatory Visit (INDEPENDENT_AMBULATORY_CARE_PROVIDER_SITE_OTHER): Payer: No Typology Code available for payment source

## 2018-05-08 DIAGNOSIS — M25562 Pain in left knee: Secondary | ICD-10-CM | POA: Diagnosis not present

## 2018-05-08 DIAGNOSIS — G8929 Other chronic pain: Secondary | ICD-10-CM | POA: Diagnosis not present

## 2018-05-08 NOTE — Progress Notes (Signed)
Office Visit Note   Patient: Meredith [name]           Date of Birth: 1990/08/08           MRN: [id number] Visit Date: 05/08/2018              Requested by: Pearline Cables, MD [address] [place], [place] [address] PCP: Pearline Cables, MD   Assessment & Plan: Visit Diagnoses:  1. 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  Her x-rays show slight eversion of the patella which can predispose her to the pain.  I recommend a PSO brace to help stabilize her patella.  Also recommend physical therapy for quadriceps strengthening.  She understands the importance of weight loss and she will continue to make all efforts to do so.  Questions encouraged and answered follow-up as needed.  Follow-Up Instructions: Return if symptoms worsen or fail to improve.   Orders:  Orders Placed This Encounter  Procedures  . XR Knee Complete 4 Views Left   No orders of the defined types were placed in this encounter.     Procedures: No procedures performed   Clinical Data: No additional findings.   Subjective: Chief Complaint  Patient presents with  . Left Knee - Pain    Meredith [name] is a very pleasant 27 year old female who comes in with chronic left knee pain for about a year.  She denies any injuries.  She has been seen her PCP Dr. Dallas Schimke for this condition.  She has lost 80 pounds but regained some of this back.  She states that she has no swelling.  Her pain is worse with using stairs and squatting.  She denies any mechanical symptoms.  Denies any previous surgeries.  Denies any numbness and tingling.  She has taken NSAIDs without significant relief.   Review of Systems  Constitutional: Negative.   HENT: Negative.   Eyes: Negative.   Respiratory: Negative.   Cardiovascular: Negative.   Endocrine: Negative.   Musculoskeletal: Negative.   Neurological: Negative.   Hematological: Negative.     Psychiatric/Behavioral: Negative.   All other systems reviewed and are negative.    Objective: Vital Signs: There were no vitals taken for this visit.  Physical Exam  Constitutional: She is oriented to person, place, and time. She appears well-developed and well-nourished.  HENT:  Head: Normocephalic and atraumatic.  Eyes: EOM are normal.  Neck: Neck supple.  Pulmonary/Chest: Effort normal.  Abdominal: Soft.  Neurological: She is alert and oriented to person, place, and time.  Skin: Skin is warm. Capillary refill takes less than 2 seconds.  Psychiatric: She has a normal mood and affect. Her behavior is normal. Judgment and thought content normal.  Nursing note and vitals reviewed.   Ortho Exam Left knee exam shows no joint effusion.  1+ patellofemoral crepitus.  Collaterals and cruciates stable.  No joint line tenderness.  Normal range of motion.  Slight pain with patellar compression.  Patella mobility is normal. Specialty Comments:  No specialty comments available.  Imaging: Xr Knee Complete 4 Views Left  Result Date: 05/08/2018 No acute or structural abnormalities.  Slightly laterally tracking anteverted patellas.    PMFS History: Patient Active Problem List   Diagnosis Date Noted  . Fever 12/22/2010  . General medical examination 12/22/2010  . LOM 01/26/2010   Past Medical History:  Diagnosis Date  . Hypoglycemia  Family History  Problem Relation Age of Onset  . Hyperlipidemia Father     No past surgical history on file. Social History   Occupational History  . Not on file  Tobacco Use  . Smoking status: Never Smoker  . Smokeless tobacco: Never Used  Substance and Sexual Activity  . Alcohol use: No  . Drug use: No  . Sexual activity: Not on file

## 2018-05-09 ENCOUNTER — Encounter: Payer: Self-pay | Admitting: Family Medicine

## 2018-05-09 ENCOUNTER — Ambulatory Visit (HOSPITAL_BASED_OUTPATIENT_CLINIC_OR_DEPARTMENT_OTHER)
Admission: RE | Admit: 2018-05-09 | Discharge: 2018-05-09 | Disposition: A | Discharge status: Home / Self Care | Payer: PRIVATE HEALTH INSURANCE | Source: Ambulatory Visit | Attending: Family Medicine | Admitting: Family Medicine

## 2018-05-09 DIAGNOSIS — R1011 Right upper quadrant pain: Secondary | ICD-10-CM | POA: Diagnosis not present

## 2019-06-26 NOTE — Progress Notes (Signed)
 [facility] at [facility] [address] [place], [place] [address] [phone]  Date:  07/01/2019   Name:  Meredith [name]   DOB:  11-28-1990   MRN:  [id number]  PCP:  Pearline Cables, MD    Chief Complaint: No chief complaint on file.   History of Present Illness:  Meredith [name] is a 29 y.o. very pleasant female patient who presents with the following:  Virtual visit today due to pandemic-  concern of ringworm on scalp Otherwise generally in good health Last seen by myself in 2019 with RUQ pain- negative RUQ [place] followed   Pt is at home, provider is at office Pt ID confirmed with 2 factors- she gives consent for a virtual viist today The patient and myself are on the call today  She hast noted "ringworm" in her scalp- at her hairline- for about 5 months She has tried lamisil cream, different shampoos- she can't seem to get rid of it  It is itchy, sometimes appears red.  She does not have any other similar lesions on her body  She otherwise feels well, no chance of current pregnancy  pcn allergic No fever or cough   Patient Active Problem List   Diagnosis Date Noted  . Fever 12/22/2010  . General medical examination 12/22/2010  . LOM 01/26/2010    Past Medical History:  Diagnosis Date  . Hypoglycemia     No past surgical history on file.  Social History   Tobacco Use  . Smoking status: Never Smoker  . Smokeless tobacco: Never Used  Substance Use Topics  . Alcohol use: No  . Drug use: No    Family History  Problem Relation Age of Onset  . Hyperlipidemia Father     Allergies  Allergen Reactions  . Penicillins Rash    Medication list has been reviewed and updated.  No current outpatient medications on file prior to visit.   No current facility-administered medications on file prior to visit.    Review of Systems:  As per HPI- otherwise negative.   Physical Examination: There were no  vitals filed for this visit. There were no vitals filed for this visit. There is no height or weight on file to calculate BMI. Ideal Body Weight:    Pt observed via video monitor She looks well, overweight.  No cough, wheezing, distress is noted She has what appears to be a tinea capitis lesion on her forehead extending into the hairline Assessment and Plan: Tinea capitis - Plan: terbinafine (LAMISIL) 250 MG tablet  Here today with likely tinea capitis. Patient has tried over-the-counter and topical treatments with out success We will start on Lamisil 250, take daily for 4 to 6 weeks Offered to bring her in for blood work, she declines at this time She will let me know if not successful Moderate medical decision making  Signed Abbe Amsterdam, MD

## 2019-07-01 ENCOUNTER — Ambulatory Visit (INDEPENDENT_AMBULATORY_CARE_PROVIDER_SITE_OTHER): Payer: Self-pay | Admitting: Family Medicine

## 2019-07-01 ENCOUNTER — Encounter: Payer: Self-pay | Admitting: Family Medicine

## 2019-07-01 ENCOUNTER — Other Ambulatory Visit: Payer: Self-pay

## 2019-07-01 DIAGNOSIS — B350 Tinea barbae and tinea capitis: Secondary | ICD-10-CM

## 2019-07-01 MED ORDER — TERBINAFINE HCL 250 MG PO TABS: 250.0000 mg | ORAL_TABLET | Freq: Every day | ORAL | 0 refills | Status: DC

## 2020-02-08 ENCOUNTER — Other Ambulatory Visit: Payer: Self-pay | Admitting: Family Medicine

## 2020-02-08 DIAGNOSIS — B350 Tinea barbae and tinea capitis: Secondary | ICD-10-CM

## 2020-02-10 ENCOUNTER — Encounter: Payer: Self-pay | Admitting: Family Medicine

## 2020-02-10 MED ORDER — METRONIDAZOLE 1 % EX GEL: Freq: Every day | CUTANEOUS | 0 refills | Status: DC

## 2020-04-28 ENCOUNTER — Encounter: Payer: Self-pay | Admitting: Medical

## 2020-04-28 ENCOUNTER — Ambulatory Visit (INDEPENDENT_AMBULATORY_CARE_PROVIDER_SITE_OTHER): Payer: PRIVATE HEALTH INSURANCE | Admitting: Medical

## 2020-04-28 ENCOUNTER — Other Ambulatory Visit: Payer: Self-pay

## 2020-04-28 VITALS — BP 150/90 | HR 105 | Resp 18 | Ht 64.0 in | Wt 316.6 lb

## 2020-04-28 DIAGNOSIS — I10 Essential (primary) hypertension: Secondary | ICD-10-CM

## 2020-04-28 DIAGNOSIS — M542 Cervicalgia: Secondary | ICD-10-CM | POA: Diagnosis not present

## 2020-04-28 DIAGNOSIS — S46811A Strain of other muscles, fascia and tendons at shoulder and upper arm level, right arm, initial encounter: Secondary | ICD-10-CM

## 2020-04-28 MED ORDER — CYCLOBENZAPRINE HCL 10 MG PO TABS: 10.0000 mg | ORAL_TABLET | Freq: Every day | ORAL | 0 refills | Status: DC

## 2020-04-28 MED ORDER — METOPROLOL SUCCINATE ER 50 MG PO TB24: 50.0000 mg | ORAL_TABLET | Freq: Every day | ORAL | 0 refills | Status: DC

## 2020-04-28 NOTE — Progress Notes (Signed)
Subjective:    Patient ID: Meredith [name], female    DOB: February 22, 1991, 29 y.o.   MRN: [id number]  HPI  Pt in for follow up.  She states mid September she got rt side pain behind her ear. Pain is worse sometimes with position of her neck/ turning her head. Pain level varies. Sometimes mild and sometimes severe. Seems to have daily pain. Ibuprofen will get rid of pain for 8 hours or sometimes stop pain for entire day. Then next day pain comes back.  No pain on inside of her rt ear.  Pt has history of migraines. With pain above this area won't have sound or light sensitivity. When she gets her migrainse has pain around eyes or temporal area.  Pt bp is very high. 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 Attending [employer]. Pt states at chiropractor office bp has been high. They did xray of spine.   LMP- oct 19,2021.   Review of Systems  Constitutional: Negative for chills, fatigue and fever.  HENT: Negative for congestion and ear pain.   Respiratory: Negative for cough, choking, shortness of breath and wheezing.   Cardiovascular: Negative for chest pain and palpitations.  Gastrointestinal: Negative for abdominal pain.  Genitourinary: Negative for dysuria.  Musculoskeletal: Positive for neck pain. Negative for back pain.       See hpi.  Neurological: Negative for dizziness, numbness and headaches.  Hematological: Negative for adenopathy.  Psychiatric/Behavioral: Negative for behavioral problems and confusion.    Past Medical History:  Diagnosis Date   Hypoglycemia      Social History   Socioeconomic History   Marital status: Single    Spouse name: Not on file   Number of children: Not on file   Years of education: Not on file   Highest education level: Not on file  Occupational History   Not on file  Tobacco Use   Smoking status: Never Smoker   Smokeless tobacco: Never Used  Substance  and Sexual Activity   Alcohol use: No   Drug use: No   Sexual activity: Not on file  Other Topics Concern   Not on file  Social History Narrative   Not on file   Social Determinants of Health   Financial Resource Strain:    Difficulty of Paying Living Expenses: Not on file  Food Insecurity:    Worried About Running Out of Food in the Last Year: Not on file   Ran Out of Food in the Last Year: Not on file  Transportation Needs:    Lack of Transportation (Medical): Not on file   Lack of Transportation (Non-Medical): Not on file  Physical Activity:    Days of Exercise per Week: Not on file   Minutes of Exercise per Session: Not on file  Stress:    Feeling of Stress : Not on file  Social Connections:    Frequency of Communication with Friends and Family: Not on file   Frequency of Social Gatherings with Friends and Family: Not on file   Attends Religious Services: Not on file   Active Member of Clubs or Organizations: Not on file   Attends Banker Meetings: Not on file   Marital Status: Not on file  Intimate Partner Violence:    Fear of Current or Ex-Partner: Not on file   Emotionally Abused: Not on file   Physically Abused: Not on file  Sexually Abused: Not on file    No past surgical history on file.  Family History  Problem Relation Age of Onset   Hyperlipidemia Father     Allergies  Allergen Reactions   Penicillins Rash    Current Outpatient Medications on File Prior to Visit  Medication Sig Dispense Refill   metroNIDAZOLE (METROGEL) 1 % gel Apply topically daily. Use as needed for skin rash (Patient not taking: Reported on 04/28/2020) 20 g 0   terbinafine (LAMISIL) 250 MG tablet Take 1 tablet (250 mg total) by mouth daily. Take for 4-6 weeks for tinea capitus (Patient not taking: Reported on 04/28/2020) 45 tablet 0   No current facility-administered medications on file prior to visit.    BP (!) 150/90    Pulse (!) 105     Resp 18    Ht 5\' 4"  (1.626 m)    Wt (!) 316 lb 9.6 oz (143.6 kg)    LMP 03/27/2020    SpO2 97%    BMI 54.34 kg/m       Objective:   Physical Exam  General Mental Status- Alert. General Appearance- Not in acute distress.   Skin General: Color- Normal Color. Moisture- Normal Moisture.  Neck Rt side of neck. Rt trapezius where inserts in to lateral occipital area is very tender to palpation/point tender.   Chest and Lung Exam Auscultation: Breath Sounds:-Normal.  Cardiovascular Auscultation:Rythm- Regular. Murmurs & Other Heart Sounds:Auscultation of the heart reveals- No Murmurs.  Abdomen Inspection:-Inspeection Normal. Palpation/Percussion:Note:No mass. Palpation and Percussion of the abdomen reveal- Non Tender, Non Distended + BS, no rebound or guarding.   Neurologic Cranial Nerve exam:- CN III-XII intact(No nystagmus), symmetric smile. Drift Test:- No drift. Finger to Nose:- Normal/Intact Strength:- 5/5 equal and symmetric strength both upper and lower extremities.  heent- rt side ear canal is clear. Tm is normal. Tragus nontender.    Assessment & Plan:  Pain in area where trapezius attaches to occipital region. Point tender area so will prescribe flexeril muscle relaxant, warm compresses twice daily and use tylenol for pain.   Not to use nsaid/ibuprofen as nsaids cane raise bp.  For htn start metoprolol 50 mg tabs.   Refer to sports medicine.  Follow up in one week or as needed. On follow up if bp better might be able to consider nsaids.  03/29/2020, PA-C   Time spent with patient today was 30  minutes which consisted of chart review, discussing diagnosis, work up treatment and documentation.

## 2020-04-28 NOTE — Patient Instructions (Addendum)
Pain in area where trapezius attaches to occipital region. Point tender area so will prescribe flexeril muscle relaxant, warm compresses twice daily and use tylenol for pain.   Not to use nsaid/ibuprofen as nsaids cane raise bp.  For htn start metoprolol 50 mg tabs.   Refer to sports medicine.  Follow up in one week or as needed. On follow up if bp better might be able to consider nsaids.

## 2020-05-05 NOTE — Progress Notes (Signed)
[facility] [address] [place], [place] [address] [phone]  Date:  05/06/2020   Name:  Meredith [name]   DOB:  1990-09-15   MRN:  [id number]  PCP:  Pearline Cables, MD    Chief Complaint: Neck Pain (one week follow up, no improvment, right side near mastoid process)   History of Present Illness:  Meredith [name] is a 29 y.o. very pleasant female patient who presents with the following:  Last seen by myself in January of this year with concern of ringworm Patient is here today for short-term follow-up visit She was seen on 11/16 with neck pain by my partner Esperanza Richters  PA-C  At that time she had complained of neck pain and also was noted to have elevated blood pressure She was prescribed Toprol for blood pressure, Flexeril for neck pain Flexeril did not help at all  She has has noted any ear pain or drainage, no redness of the ear or over the mastoid process She does wear a headset on her right ear at [employer] she has not noted any hearing change   Pt first noted this neck pain and headaches 2 months ago-approximately late september She is also getting daily headaches since that time Can occur at different times of day Cool helps, head makes it worse She is seeing a chiropractor, has done so for a couple of years-apparently they told her she had inflammation in her neck She is using ibuprofen which does help - however we tried to change her over to tylenol due to elevated BP She has tried ice and topical essential oils  She had some neck films per her chiro but is not really sure of the results  She feels pain if she shakes her head 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 Fever 12/22/2010  . General medical examination 12/22/2010  . LOM 01/26/2010    Past Medical History:  Diagnosis Date  . Hypoglycemia     No past surgical history on file.  Social History   Tobacco Use  . Smoking status: Never Smoker  . Smokeless tobacco: Never Used  Substance Use Topics  . Alcohol use: No  . Drug use: No    Family History  Problem Relation Age of Onset  . Hyperlipidemia Father     Allergies  Allergen Reactions  . Penicillins Rash    Medication list has been reviewed and updated.  Current Outpatient Medications on File Prior to Visit  Medication Sig Dispense Refill  . cyclobenzaprine (FLEXERIL) 10 MG tablet Take 1 tablet (10 mg total) by mouth at bedtime. (Patient taking differently: Take 10 mg by mouth daily as needed. ) 7 tablet 0  . metoprolol succinate (TOPROL-XL) 50 MG 24 hr tablet Take 1 tablet (50 mg total) by mouth daily. Take with or immediately following a meal. 30 tablet 0   No current facility-administered medications on file prior to visit.    Review of Systems:  As per HPI- otherwise  negative.   Physical Examination: Vitals:   05/06/20 1022 05/06/20 1040  BP: (!) 160/100 (!) 150/90  Pulse: (!) 107   Resp: 18   SpO2: 97%    Vitals:   05/06/20 1022  Weight: (!) 316 lb (143.3 kg)  Height: 5\' 4"  (1.626 m)   Body mass index is 54.24 kg/m. Ideal Body Weight: Weight in (lb) to have BMI = 25: 145.3  GEN: no acute distress.  obese, otherwise looks well HEENT: Atraumatic, Normocephalic. Bilateral TM wnl, oropharynx normal.  PEERL,EOMI.   no tenderness to pressure on teeth.  Both ears appear normal, no tenderness over the mastoid.  No redness or swelling no bony tenderness of the cervical spine, normal cervical range of motion Ears and Nose: No external  deformity. CV: RRR, No M/G/R. No JVD. No thrill. No extra heart sounds. PULM: CTA B, no wheezes, crackles, rhonchi. No retractions. No resp. distress. No accessory muscle use. EXTR: No c/c/e PSYCH: Normally interactive. Conversant.    Assessment and Plan: Neck pain - Plan: DG Cervical Spine Complete  Right ear pain - Plan: POCT urine pregnancy  Hypertension, unspecified type - Plan: CBC, Comprehensive metabolic panel  Screening for diabetes mellitus - Plan: Hemoglobin A1c  Other headache syndrome - Plan: CT Head Wo Contrast  Screening for hyperlipidemia - Plan: Lipid panel  Screening for thyroid disorder - Plan: TSH   patient here today to follow-up on blood pressure and pain behind her right ear. Blood pressure is elevated, but she has not taken her metoprolol yet today.  I advised her to take metoprolol when she gets home, would also suggest purchasing a home blood pressure cuff  Ordered a full set of cervical spine films to be done at [facility] imaging.  Patient will get this done at her earliest convenience  I also ordered a CT of her head to evaluate for any abnormality in the mastoid area and to evaluate for change in headache pattern  She will let me know if any change or worsening of her symptoms in the meantime routine labs also pending as above  This visit occurred during the SARS-CoV-2 public health emergency.  Safety protocols were in place, including screening questions prior to the visit, additional usage of staff PPE, and extensive cleaning of exam room while observing appropriate contact time as indicated for disinfecting solutions.    Signed [facility], MD  Received her labs as below, message to patient  Results for orders placed or performed in visit on 05/06/20  CBC  Result Value Ref Range   WBC 10.9 (H) 4.0 - 10.5 K/uL   RBC 4.85 3.87 - 5.11 Mil/uL   Platelets 299.0 150 - 400 K/uL   Hemoglobin 12.7 12.0 - 15.0 g/dL   HCT 05/08/20 36 - 46 %   MCV  81.5 78.0 - 100.0 fl   MCHC 32.3 30.0 - 36.0 g/dL   RDW 80.9 98.3 - 38.2 %  Comprehensive metabolic panel  Result Value Ref Range   Sodium 137 135 - 145 mEq/L   Potassium 4.0 3.5 - 5.1 mEq/L   Chloride 101 96 - 112 mEq/L   CO2 26 19 - 32 mEq/L   Glucose, Bld 87 70 - 99 mg/dL   BUN 10 6 - 23 mg/dL   Creatinine, Ser 50.5 0.40 - 1.20 mg/dL   Total Bilirubin 0.4 0.2 - 1.2 mg/dL   Alkaline Phosphatase 69 39 - 117 U/L   AST 14 0 - 37 U/L   ALT 18  0 - 35 U/L   Total Protein 7.4 6.0 - 8.3 g/dL   Albumin 4.3 3.5 - 5.2 g/dL   GFR 295.18 >84.16 mL/min   Calcium 9.2 8.4 - 10.5 mg/dL  Hemoglobin S0Y  Result Value Ref Range   Hgb A1c MFr Bld 5.8 4.6 - 6.5 %  Lipid panel  Result Value Ref Range   Cholesterol 198 0 - 200 mg/dL   Triglycerides 301.6 0 - 149 mg/dL   HDL 01.09 >32.35 mg/dL   VLDL 57.3 0.0 - 22.0 mg/dL   LDL Cholesterol 254 (H) 0 - 99 mg/dL   Total CHOL/HDL Ratio 5    NonHDL 157.35   TSH  Result Value Ref Range   TSH 1.60 0.35 - 4.50 uIU/mL  POCT urine pregnancy  Result Value Ref Range   Preg Test, Ur Negative Negative

## 2020-05-05 NOTE — Patient Instructions (Addendum)
It was great to see you again today Please do take your BP medication when you get home You might want to invest in a BP cuff for home Your films are ordered for [facility]- [address].  You can go over there for your cervical spine films at your convenience We will get your head CT approved and set up for you as well  If getting worse or any other concerns please contact me Please pick up a bottle of tylenol and try it for your headaches instead of ibuprofen which may increase your BP

## 2020-05-06 ENCOUNTER — Ambulatory Visit (INDEPENDENT_AMBULATORY_CARE_PROVIDER_SITE_OTHER): Payer: PRIVATE HEALTH INSURANCE | Admitting: Family Medicine

## 2020-05-06 ENCOUNTER — Other Ambulatory Visit: Payer: Self-pay

## 2020-05-06 ENCOUNTER — Encounter: Payer: Self-pay | Admitting: Family Medicine

## 2020-05-06 VITALS — BP 150/90 | HR 107 | Resp 18 | Ht 64.0 in | Wt 316.0 lb

## 2020-05-06 DIAGNOSIS — H9201 Otalgia, right ear: Secondary | ICD-10-CM | POA: Diagnosis not present

## 2020-05-06 DIAGNOSIS — I10 Essential (primary) hypertension: Secondary | ICD-10-CM | POA: Diagnosis not present

## 2020-05-06 DIAGNOSIS — Z1329 Encounter for screening for other suspected endocrine disorder: Secondary | ICD-10-CM

## 2020-05-06 DIAGNOSIS — M542 Cervicalgia: Secondary | ICD-10-CM | POA: Diagnosis not present

## 2020-05-06 DIAGNOSIS — R7303 Prediabetes: Secondary | ICD-10-CM | POA: Insufficient documentation

## 2020-05-06 DIAGNOSIS — Z13220 Encounter for screening for lipoid disorders: Secondary | ICD-10-CM

## 2020-05-06 DIAGNOSIS — G4489 Other headache syndrome: Secondary | ICD-10-CM

## 2020-05-06 DIAGNOSIS — Z131 Encounter for screening for diabetes mellitus: Secondary | ICD-10-CM | POA: Diagnosis not present

## 2020-05-06 LAB — COMPREHENSIVE METABOLIC PANEL
ALT: 18 U/L (ref 0–35)
AST: 14 U/L (ref 0–37)
Albumin: 4.3 g/dL (ref 3.5–5.2)
Alkaline Phosphatase: 69 U/L (ref 39–117)
BUN: 10 mg/dL (ref 6–23)
CO2: 26 mEq/L (ref 19–32)
Calcium: 9.2 mg/dL (ref 8.4–10.5)
Chloride: 101 mEq/L (ref 96–112)
Creatinine, Ser: 0.57 mg/dL (ref 0.40–1.20)
GFR: 123.36 mL/min (ref >60.00)
Glucose, Bld: 87 mg/dL (ref 70–99)
Potassium: 4 mEq/L (ref 3.5–5.1)
Sodium: 137 mEq/L (ref 135–145)
Total Bilirubin: 0.4 mg/dL (ref 0.2–1.2)
Total Protein: 7.4 g/dL (ref 6.0–8.3)

## 2020-05-06 LAB — CBC
HCT: 39.5 % (ref 36.0–46.0)
Hemoglobin: 12.7 g/dL (ref 12.0–15.0)
MCHC: 32.3 g/dL (ref 30.0–36.0)
MCV: 81.5 fl (ref 78.0–100.0)
Platelets: 299 10*3/uL (ref 150.0–400.0)
RBC: 4.85 Mil/uL (ref 3.87–5.11)
RDW: 15.5 % (ref 11.5–15.5)
WBC: 10.9 10*3/uL — ABNORMAL HIGH (ref 4.0–10.5)

## 2020-05-06 LAB — HEMOGLOBIN A1C: Hgb A1c MFr Bld: 5.8 % (ref 4.6–6.5)

## 2020-05-06 LAB — POCT URINE PREGNANCY: Preg Test, Ur: NEGATIVE

## 2020-05-06 LAB — LIPID PANEL
Cholesterol: 198 mg/dL (ref 0–200)
HDL: 40.3 mg/dL (ref >39.00)
LDL Cholesterol: 130 mg/dL — ABNORMAL HIGH (ref 0–99)
NonHDL: 157.35
Total CHOL/HDL Ratio: 5
Triglycerides: 137 mg/dL (ref 0.0–149.0)
VLDL: 27.4 mg/dL (ref 0.0–40.0)

## 2020-05-06 LAB — TSH: TSH: 1.6 u[IU]/mL (ref 0.35–4.50)

## 2020-05-27 ENCOUNTER — Ambulatory Visit
Admission: RE | Admit: 2020-05-27 | Discharge: 2020-05-27 | Disposition: A | Discharge status: Home / Self Care | Payer: PRIVATE HEALTH INSURANCE | Source: Ambulatory Visit | Attending: Family Medicine | Admitting: Family Medicine

## 2020-05-27 ENCOUNTER — Other Ambulatory Visit: Payer: Self-pay

## 2020-05-27 ENCOUNTER — Encounter: Payer: Self-pay | Admitting: Family Medicine

## 2020-05-27 DIAGNOSIS — M542 Cervicalgia: Secondary | ICD-10-CM

## 2020-05-27 DIAGNOSIS — G4489 Other headache syndrome: Secondary | ICD-10-CM

## 2020-05-27 DIAGNOSIS — H9201 Otalgia, right ear: Secondary | ICD-10-CM

## 2020-05-28 ENCOUNTER — Encounter: Payer: Self-pay | Admitting: Neurology

## 2020-05-28 NOTE — Addendum Note (Signed)
Addended by: Abbe Amsterdam C on: 05/28/2020 05:45 AM   Modules accepted: Orders

## 2020-06-04 ENCOUNTER — Other Ambulatory Visit: Payer: Self-pay | Admitting: Medical

## 2020-06-08 MED ORDER — METOPROLOL SUCCINATE ER 50 MG PO TB24: 50.0000 mg | ORAL_TABLET | Freq: Every day | ORAL | 0 refills | Status: DC

## 2020-07-06 ENCOUNTER — Other Ambulatory Visit: Payer: Self-pay | Admitting: Family Medicine

## 2020-07-06 MED ORDER — METOPROLOL SUCCINATE ER 50 MG PO TB24: 50.0000 mg | ORAL_TABLET | Freq: Every day | ORAL | 0 refills | Status: DC

## 2020-07-21 ENCOUNTER — Ambulatory Visit: Payer: PRIVATE HEALTH INSURANCE | Admitting: Neurology

## 2020-08-04 ENCOUNTER — Encounter: Payer: Self-pay | Admitting: Family Medicine

## 2020-08-04 MED ORDER — METOPROLOL SUCCINATE ER 50 MG PO TB24: 50.0000 mg | ORAL_TABLET | Freq: Every day | ORAL | 3 refills | Status: DC

## 2020-08-06 MED ORDER — METOPROLOL SUCCINATE ER 50 MG PO TB24: 50.0000 mg | ORAL_TABLET | Freq: Every day | ORAL | 3 refills | Status: DC

## 2020-08-06 NOTE — Addendum Note (Signed)
Addended by: Thelma Barge D on: 08/06/2020 05:01 PM   Modules accepted: Orders

## 2021-03-11 IMAGING — CT CT HEAD W/O CM
1 series · 16 of 30 positions shown, 20 images · non-contrast
Comparison: None.

CLINICAL DATA: Headache with nausea

EXAM:
CT HEAD WITHOUT CONTRAST
TECHNIQUE: Contiguous axial images were obtained from the base of the skull
through the vertex without intravenous contrast.

[Series 2: head w/(date) · axial · 0.45mm/px · z∈[-204,-49]mm · 16 of 35 slices shown, 20 images]
[im 2/35  brain]
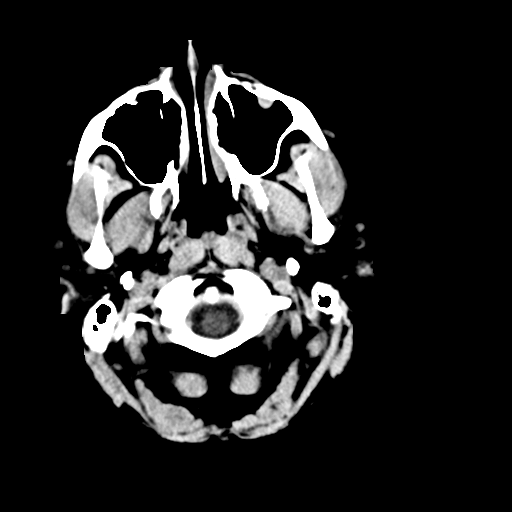
[im 2/35  bone]
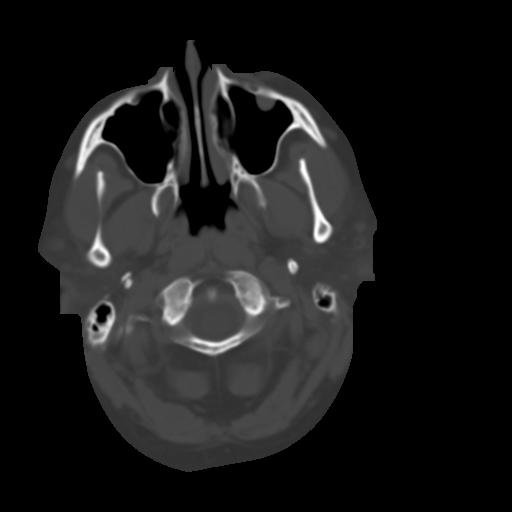
[im 4/35  brain]
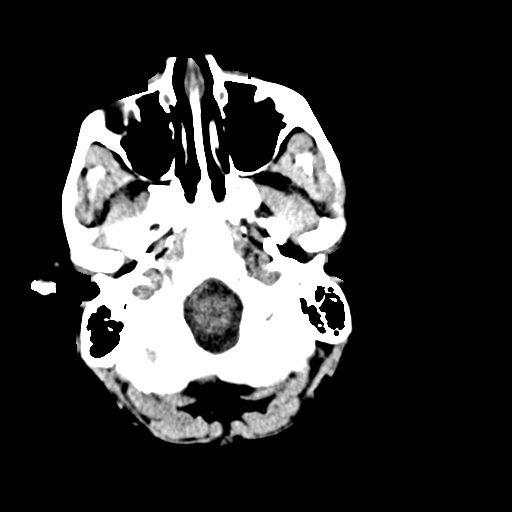
[im 6/35  brain]
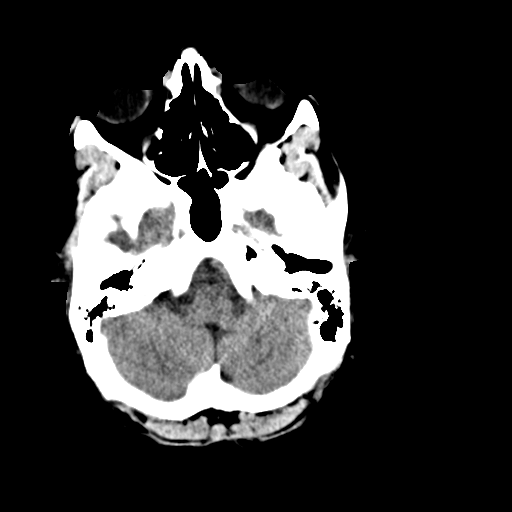
[im 9/35  brain]
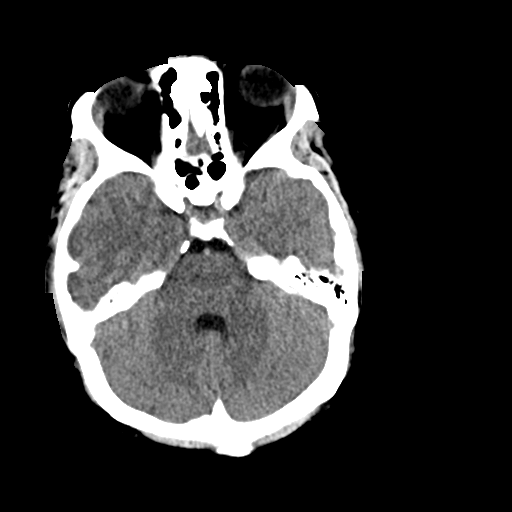
[im 10/35  brain]
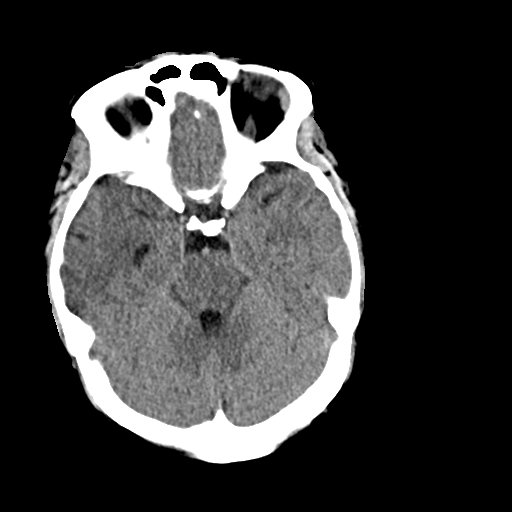
[im 10/35  bone]
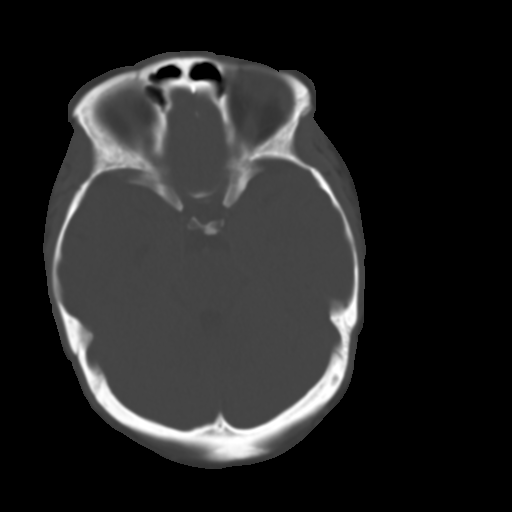
[im 12/35  brain]
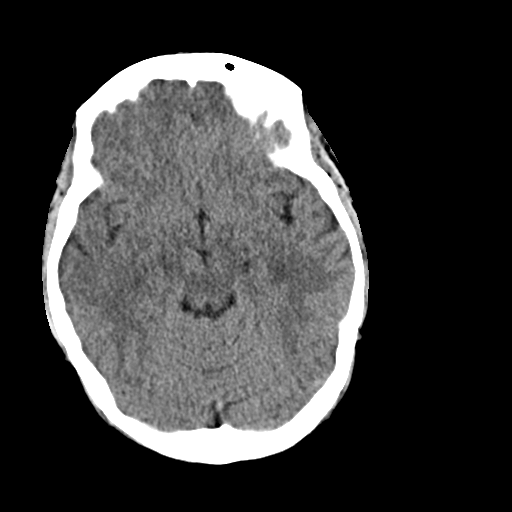
[im 15/35  brain]
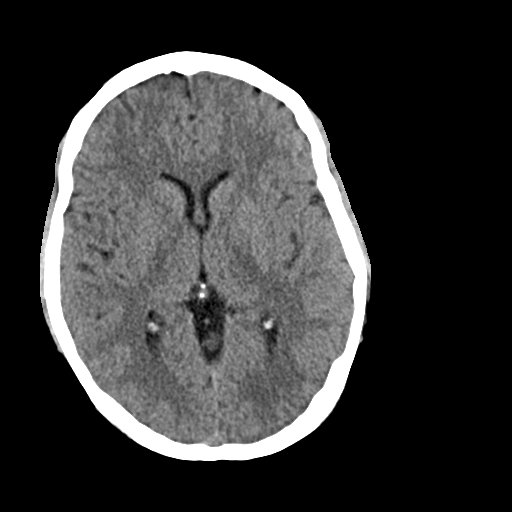
[im 17/35  brain]
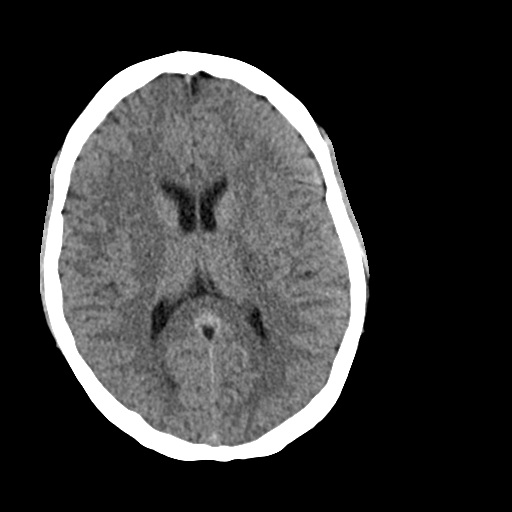
[im 18/35  brain]
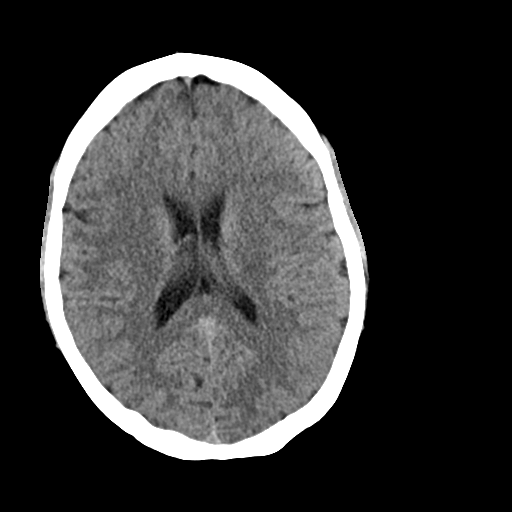
[im 18/35  bone]
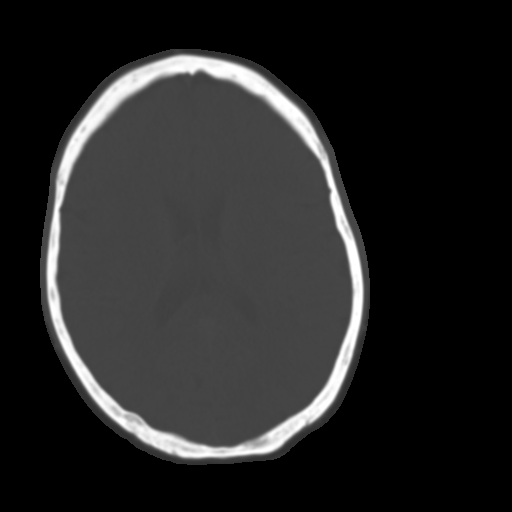
[im 20/35  brain]
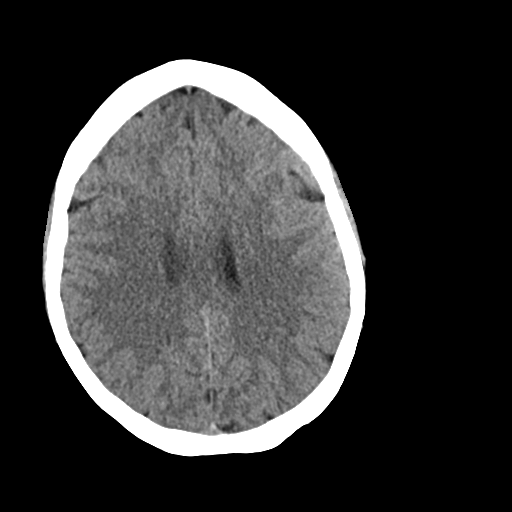
[im 23/35  brain]
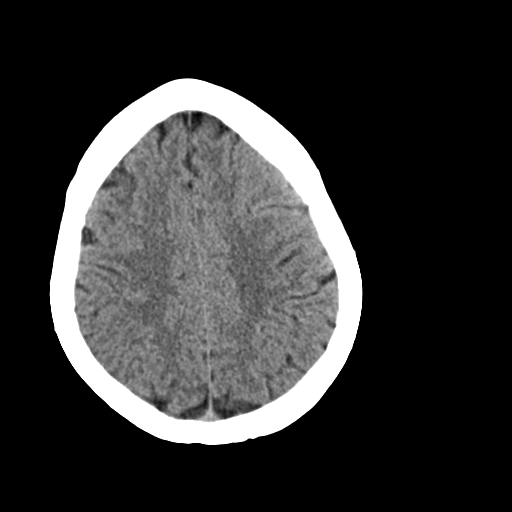
[im 25/35  brain]
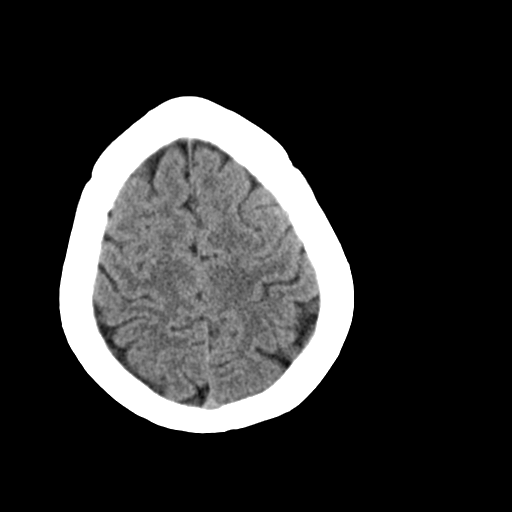
[im 26/35  brain]
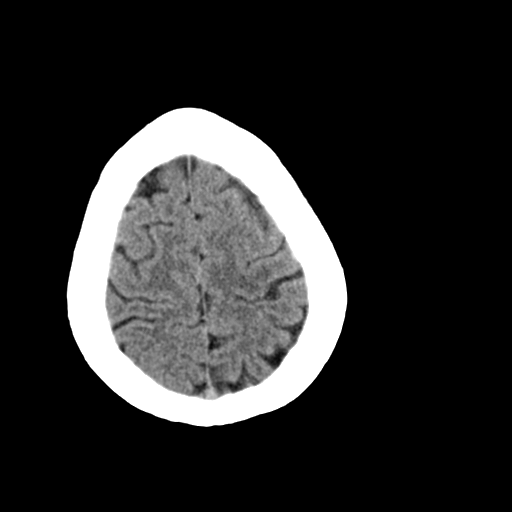
[im 26/35  bone]
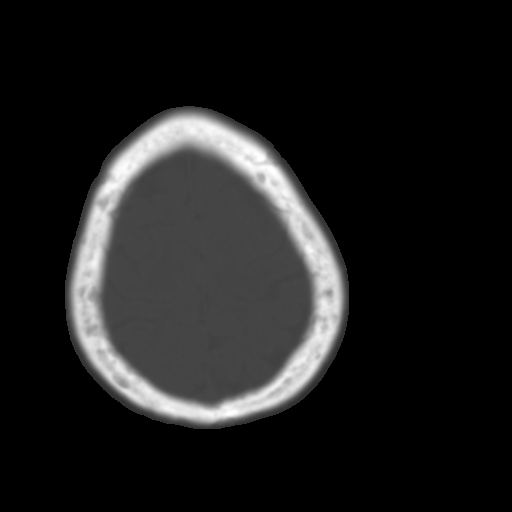
[im 29/35  brain]
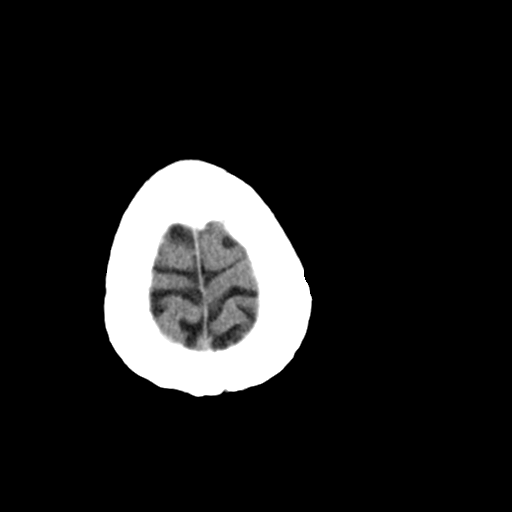
[im 31/35  brain]
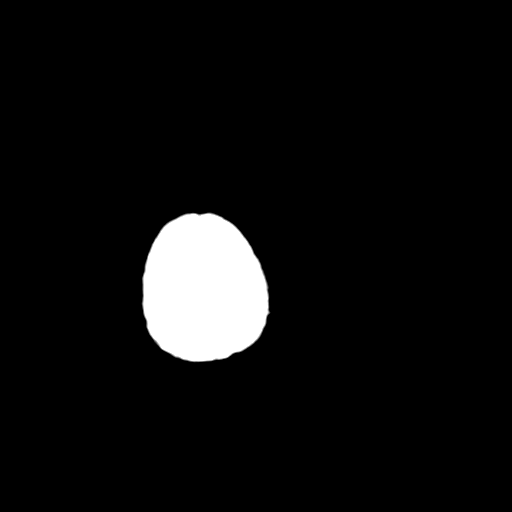
[im 33/35  brain]
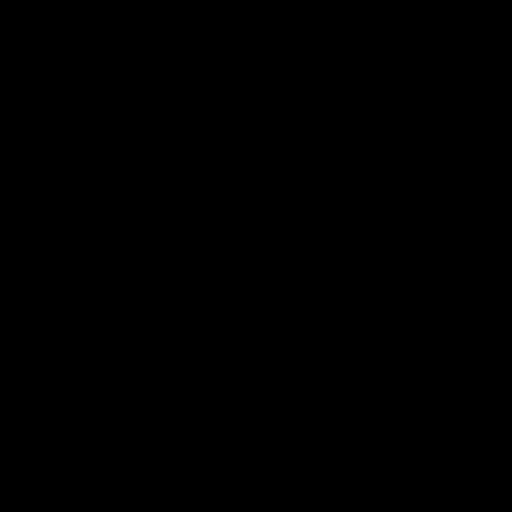

[16 of 30 positions shown; findings below may reference images not displayed]

FINDINGS: Brain: No acute territorial infarction, hemorrhage or intracranial
mass. The ventricles are nonenlarged.

Vascular: No hyperdense vessel or unexpected calcification.

Skull: Normal. Negative for fracture or focal lesion.

Sinuses/Orbits: Mucous retention cyst in the left maxillary sinus

Other: None
IMPRESSION: Negative non contrasted CT appearance of the brain.

## 2021-07-19 ENCOUNTER — Encounter: Payer: Self-pay | Admitting: Family Medicine

## 2021-07-19 MED ORDER — METOPROLOL SUCCINATE ER 50 MG PO TB24: 50.0000 mg | ORAL_TABLET | Freq: Every day | ORAL | 0 refills | Status: DC

## 2021-07-20 NOTE — Progress Notes (Signed)
[facility] at [facility] [address] [place], [place] [address] [phone]  Date:  07/22/2021   Name:  Meredith [name]   DOB:  28-Aug-1990   MRN:  [id number]  PCP:  Pearline Cables, MD    Chief Complaint: Medication Refill (Concerns/ questions: none/Flu shot today:decline )   History of Present Illness:  Meredith [name] is a 31 y.o. 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  She notes this is an important job but it is extremely fast-paced and quite stressful.  She is finishing school to start a new career and hopes to have less stress in her life  She plans to get a home cuff this week so she can monitor her blood pressure She had some labs at her job 2 weeks ago -she reports good results, will try to get me a copy Her A1c was 5.4% per her report   BP Readings from Last 3 Encounters:  07/22/21 124/80  05/06/20 (!) 150/90  04/28/20 (!) 150/90   Pulse Readings from Last 3 Encounters:  07/22/21 (!) 104  05/06/20 (!) 107  04/28/20 (!) 105    Patient Active Problem List   Diagnosis Date Noted   Prediabetes 05/06/2020   General medical examination 12/22/2010   LOM 01/26/2010    Past Medical History:  Diagnosis Date   Hypoglycemia     No past surgical history on file.  Social History   Tobacco Use   Smoking status: Never   Smokeless tobacco: Never  Substance Use Topics   Alcohol use: No   Drug use: No    Family History  Problem Relation Age of Onset   Hyperlipidemia  Father     Allergies  Allergen Reactions   Penicillins Rash    Medication list has been reviewed and updated.  No current outpatient medications on file prior to visit.   No current facility-administered medications on file prior to visit.    Review of Systems:  As per HPI- otherwise negative.   Physical Examination: Vitals:   07/22/21 1539  BP: 124/80  Pulse: (!) 104  Resp: 18  Temp: 98.4 F (36.9 C)  SpO2: 98%   Vitals:   07/22/21 1539  Weight: (!) 314 lb 9.6 oz (142.7 kg)  Height: 5\' 4"  (1.626 m)   Body mass index is 54 kg/m. Ideal Body Weight: Weight in (lb) to have BMI = 25: 145.3  GEN: no acute distress.  Obese, looks well HEENT: Atraumatic, Normocephalic.  Ears and Nose: No external deformity. CV: RRR, No M/G/R. No JVD. No thrill. No extra heart sounds. PULM: CTA B, no wheezes, crackles, rhonchi. No retractions. No resp. distress. No accessory muscle use. ABD: S, NT, ND EXTR: No c/c/e PSYCH: Normally interactive. Conversant.    Assessment and Plan: Hypertension, unspecified type - Plan: metoprolol succinate (TOPROL-XL) 50 MG 24 hr tablet  Class 3 severe obesity with serious comorbidity and body mass index (BMI) of 50.0  to 59.9 in adult, unspecified obesity type [facility])  Patient seen today for follow-up.  Her blood pressure is better than usual, she is still taking metoprolol.  We discussed potentially tapering this medication, would recommend decreasing to 25 mg for a period of time before stopping.  She plans to get a home blood pressure cuff  She endorses being under less stressful conditions, which we hope will also help with her blood pressure  I did encourage her to keep working on a healthy lifestyle and weight loss, these changes will also likely improve her blood pressure  Signed Abbe Amsterdam, MD

## 2021-07-22 ENCOUNTER — Ambulatory Visit: Payer: Self-pay | Admitting: Family Medicine

## 2021-07-22 VITALS — BP 124/80 | HR 104 | Temp 98.4°F | Resp 18 | Ht 64.0 in | Wt 314.6 lb

## 2021-07-22 DIAGNOSIS — I10 Essential (primary) hypertension: Secondary | ICD-10-CM

## 2021-07-22 DIAGNOSIS — Z6841 Body Mass Index (BMI) 40.0 and over, adult: Secondary | ICD-10-CM

## 2021-07-22 MED ORDER — METOPROLOL SUCCINATE ER 50 MG PO TB24: 50.0000 mg | ORAL_TABLET | Freq: Every day | ORAL | 3 refills | Status: DC

## 2021-07-22 NOTE — Patient Instructions (Signed)
Good to see you today!   Your BP looks good!    We can try decreasing your metoprolol to 25 at your convenience and see how your BP and pulse respond You can cut your toprol in half if you like!   If possible, I would like to get a copy of your recent labs so I can update your chart

## 2022-02-18 ENCOUNTER — Ambulatory Visit: Payer: PRIVATE HEALTH INSURANCE | Admitting: Family

## 2022-02-18 ENCOUNTER — Encounter: Payer: Self-pay | Admitting: Family

## 2022-02-18 VITALS — BP 130/90 | HR 97 | Temp 98.6°F | Ht 64.0 in | Wt 319.2 lb

## 2022-02-18 DIAGNOSIS — R109 Unspecified abdominal pain: Secondary | ICD-10-CM | POA: Diagnosis not present

## 2022-02-18 MED ORDER — PANTOPRAZOLE SODIUM 40 MG PO TBEC: 40.0000 mg | DELAYED_RELEASE_TABLET | Freq: Two times a day (BID) | ORAL | 0 refills | Status: DC

## 2022-02-18 NOTE — Patient Instructions (Signed)
Esophageal Spasm  An esophageal spasm is a sudden tightening (contraction) of the esophagus, which is the part of the body that moves food from the mouth to the stomach. Normally, smooth, wave-like muscle contractions move food and liquids down the esophagus. Esophageal spasms are abnormal muscle contractions that can cause chest pain and trouble swallowing (dysphagia). Spasms may also cause swallowed foods or liquids to come back up into the throat (regurgitation). There are two types of esophageal spasms. You may have one or both types: Diffuse esophageal spasms. These are irregular, uncoordinated spasms. This type tends to cause more dysphagia. Nutcracker esophagus. This is a type of spasm in which the muscles move normally, but the contraction is very strong. This type tends to be more painful. Severe esophageal spasms can make it hard to eat and do everyday activities. They often occur with severe heartburn (reflux esophagitis). The symptoms can come and go and may be triggered or worsened depending on your diet or other medical issues. What are the causes? The cause of esophageal spasms is not known. What increases the risk? The following factors may make you more likely to develop esophageal spasms: Being female. Age. The risk may increase as you get older. Depression or anxiety. Having gastroesophageal reflux disease (GERD). What are the signs or symptoms? Symptoms may vary from day to day. They may be mild or severe. They may last for minutes or hours. Common symptoms include: Chest pain. This may feel like a heart attack. Back pain. Dysphagia. Heartburn. A feeling that something is stuck in the throat (globus). Regurgitation of foods or liquids. For some people, certain things may trigger symptoms, such as: Certain foods and drinks. These may include very hot or very cold foods or drinks. Eating very quickly. How is this diagnosed? This condition may be diagnosed based on your  symptoms and a physical exam. You may have tests, such as: Endoscopy. This involves using a flexible tube that has a camera on the end of it (endoscope) to look down your throat and examine your esophagus. Barium swallow. For this test, you drink a substance that will show up well on X-rays (barium) and then have X-rays to see how the substance moves through your esophagus. Esophageal manometry. This involves passing a small, thin tube through your nose and down into your throat. The tube contains pressure sensors that measure muscle contractions in the esophagus while you swallow. How is this treated? Mild esophageal spasms may not need treatment. You may be able to manage the spasms by avoiding triggers. For more frequent or severe spasms, treatment may include: Medicine to: Relax the esophageal muscles. Relieve muscle spasms (calcium channel blockers and nitrates). Relieve pain by blocking nerve endings in the esophagus. This is done with an injection of a toxin (botulinum). Relieve heartburn (proton pump inhibitors). Antidepressant medicines. These are sometimes used to ease symptoms. Surgery to reduce esophageal muscle contractions (myotomy). This may be needed for very severe cases. Follow these instructions at home: Eating and drinking Keep track of foods, drinks, and habits that trigger spasms or heartburn. Avoid these triggers as much as you can. Eat meals slowly. Chew food completely before swallowing. Avoid swallowing foods and drinks when they are very hot or very cold. General instructions Take over-the-counter and prescription medicines only as told by your health care provider. Find ways to manage stress, such as regular exercise or meditation. If you struggle with depression or anxiety, talk with your health care provider about treatment options. Keep all  follow-up visits. This is important. Contact a health care provider if: Your symptoms get worse or do not get better with  medicine. You are losing weight because of dysphagia. Your esophageal spasms affect your quality of life, such as your ability to eat. Get help right away if: You have severe chest pain. You have chest pain that is different from your usual chest pain. You have trouble breathing. You choke. These symptoms may represent a serious problem that is an emergency. Do not wait to see if the symptoms will go away. Get medical help right away. Call your local emergency services (911 in the [place] Do not drive yourself to the hospital. Summary An esophageal spasm is a sudden tightening (contraction) of the esophagus, which is the part of the body that moves food from the mouth to the stomach. These abnormal muscle contractions can cause chest pain and trouble swallowing (dysphagia). The cause of esophageal spasms is not known. Treatment may not be needed for mild spasms. For frequent or more severe spasms, treatment may include medicine, or, for very severe spasms, surgery. Keep track of foods, drinks, and habits that trigger spasms or heartburn. Avoid these triggers as much as you can. This information is not intended to replace advice given to you by your health care provider. Make sure you discuss any questions you have with your health care provider. Document Revised: 01/18/2020 Document Reviewed: 01/18/2020 Elsevier Patient Education  2023 [employer].

## 2022-02-18 NOTE — Progress Notes (Signed)
Meredith [name] is a 31 y.o. female with the following history as recorded in Epic[employer]:  Patient Active Problem List   Diagnosis Date Noted   Prediabetes 05/06/2020   General medical examination 12/22/2010   LOM 01/26/2010    Current Outpatient Medications  Medication Sig Dispense Refill   metoprolol succinate (TOPROL-XL) 50 MG 24 hr tablet Take 1 tablet (50 mg total) by mouth daily. Take with or immediately following a meal. 90 tablet 3   pantoprazole (PROTONIX) 40 MG tablet Take 1 tablet (40 mg total) by mouth 2 (two) times daily before a meal. 60 tablet 0   No current facility-administered medications for this visit.    Allergies: Penicillins  Past Medical History:  Diagnosis Date   Hypoglycemia     No past surgical history on file.  Family History  Problem Relation Age of Onset   Hyperlipidemia Father     Social History   Tobacco Use   Smoking status: Never   Smokeless tobacco: Never  Substance Use Topics   Alcohol use: No    Subjective:  Abdominal pain x 2-3 weeks; describes as "burning squeezing sensation in my throat." Food is not getting stuck- no difficulty swallowing; Has felt more bloated recently; + nausea; does not feel that food is getting stuck; limited benefit with Tums; did have gallbladder U/S in 2019 (normal) but is concerned that has underlying gallbladder disease; also feels "hard spot" on her abdomen;     Objective:  Vitals:   02/18/22 1440 02/18/22 1509  BP: (!) 168/90 (!) 130/90  Pulse: 97   Temp: 98.6 F (37 C)   TempSrc: Oral   SpO2: 96%   Weight: (!) 319 lb 3.2 oz (144.8 kg)   Height: $Remove'5\' 4"'WQjYuAE$  (1.626 m)     General: Well developed, well nourished, in no acute distress  Skin : Warm and dry.  Head: Normocephalic and atraumatic  Eyes: Sclera and conjunctiva clear; pupils round and reactive to light; extraocular movements intact  Ears: External normal; canals clear; tympanic membranes normal  Oropharynx: Pink, supple. No suspicious lesions   Neck: Supple without thyromegaly, adenopathy  Lungs: Respirations unlabored;  Abdomen: Soft; nontender; nondistended; normoactive bowel sounds; no masses or hepatosplenomegaly  Neurologic: Alert and oriented; speech intact; face symmetrical; moves all extremities well; CNII-XII intact without focal deficit  Assessment:  1. Abdominal pain, unspecified abdominal location     Plan:  Discussed possible source of symptoms including H. Pylori, esophageal spasm, hiatal hernia/GERD and/or gallbladder disease; will update abdominal/ pelvic CT to better evaluate suspected hiatal hernia; will give patient trial of Protonix 40 mg bid; to consider referral to GI and/or surgery to discuss her gallbladder.   No follow-ups on file.  Orders Placed This Encounter  Procedures   CT Abdomen Pelvis W Contrast    Standing Status:   Future    Standing Expiration Date:   02/19/2023    Order Specific Question:   If indicated for the ordered procedure, I authorize the administration of contrast media per Radiology protocol    Answer:   Yes    Order Specific Question:   Is patient pregnant?    Answer:   No    Order Specific Question:   Preferred imaging location?    Answer:   [facility] Specific Question:   Is Oral Contrast requested for this exam?    Answer:   Yes, Per Radiology protocol   CBC with Differential/Platelet   Comp Met (CMET)  H. pylori breath test    Requested Prescriptions   Signed Prescriptions Disp Refills   pantoprazole (PROTONIX) 40 MG tablet 60 tablet 0    Sig: Take 1 tablet (40 mg total) by mouth 2 (two) times daily before a meal.

## 2022-02-19 LAB — CBC WITH DIFFERENTIAL/PLATELET
Absolute Monocytes: 824 cells/uL (ref 200–950)
Basophils Absolute: 35 cells/uL (ref 0–200)
Basophils Relative: 0.3 %
Eosinophils Absolute: 93 cells/uL (ref 15–500)
Eosinophils Relative: 0.8 %
HCT: 37.9 % (ref 35.0–45.0)
Hemoglobin: 12.5 g/dL (ref 11.7–15.5)
Lymphs Abs: 3689 cells/uL (ref 850–3900)
MCH: 25.6 pg — ABNORMAL LOW (ref 27.0–33.0)
MCHC: 33 g/dL (ref 32.0–36.0)
MCV: 77.7 fL — ABNORMAL LOW (ref 80.0–100.0)
MPV: 11.4 fL (ref 7.5–12.5)
Monocytes Relative: 7.1 %
Neutro Abs: 6960 cells/uL (ref 1500–7800)
Neutrophils Relative %: 60 %
Platelets: 342 10*3/uL (ref 140–400)
RBC: 4.88 10*6/uL (ref 3.80–5.10)
RDW: 15 % (ref 11.0–15.0)
Total Lymphocyte: 31.8 %
WBC: 11.6 10*3/uL — ABNORMAL HIGH (ref 3.8–10.8)

## 2022-02-19 LAB — COMPREHENSIVE METABOLIC PANEL
AG Ratio: 1.4 (calc) (ref 1.0–2.5)
ALT: 15 U/L (ref 6–29)
AST: 12 U/L (ref 10–30)
Albumin: 4.4 g/dL (ref 3.6–5.1)
Alkaline phosphatase (APISO): 62 U/L (ref 31–125)
BUN: 15 mg/dL (ref 7–25)
CO2: 23 mmol/L (ref 20–32)
Calcium: 9.4 mg/dL (ref 8.6–10.2)
Chloride: 104 mmol/L (ref 98–110)
Creat: 0.7 mg/dL (ref 0.50–0.97)
Globulin: 3.1 g/dL (calc) (ref 1.9–3.7)
Glucose, Bld: 99 mg/dL (ref 65–99)
Potassium: 4.3 mmol/L (ref 3.5–5.3)
Sodium: 138 mmol/L (ref 135–146)
Total Bilirubin: 0.2 mg/dL (ref 0.2–1.2)
Total Protein: 7.5 g/dL (ref 6.1–8.1)

## 2022-02-21 ENCOUNTER — Telehealth (HOSPITAL_BASED_OUTPATIENT_CLINIC_OR_DEPARTMENT_OTHER): Payer: Self-pay

## 2022-02-22 ENCOUNTER — Telehealth: Payer: Self-pay | Admitting: Family Medicine

## 2022-02-22 LAB — H. PYLORI BREATH TEST

## 2022-02-22 NOTE — Telephone Encounter (Signed)
Patient returning call to discuss labs. Patient aware nurse will call her back to discuss.

## 2022-02-23 NOTE — Telephone Encounter (Signed)
I have called pt back and relayed the results and she stated understanding yesterday. Message was put into the lab notes.

## 2022-02-28 ENCOUNTER — Ambulatory Visit (HOSPITAL_BASED_OUTPATIENT_CLINIC_OR_DEPARTMENT_OTHER)
Admission: RE | Admit: 2022-02-28 | Discharge: 2022-02-28 | Disposition: A | Discharge status: Home / Self Care | Payer: PRIVATE HEALTH INSURANCE | Source: Ambulatory Visit | Attending: Family | Admitting: Family

## 2022-02-28 ENCOUNTER — Encounter (HOSPITAL_BASED_OUTPATIENT_CLINIC_OR_DEPARTMENT_OTHER): Payer: Self-pay

## 2022-02-28 DIAGNOSIS — R109 Unspecified abdominal pain: Secondary | ICD-10-CM | POA: Diagnosis present

## 2022-02-28 MED ORDER — IOHEXOL 300 MG/ML  SOLN
100.0000 mL | Freq: Once | INTRAMUSCULAR | Status: AC | PRN
  Administered 2022-02-28: 100 mL via INTRAVENOUS

## 2022-03-02 ENCOUNTER — Ambulatory Visit: Payer: PRIVATE HEALTH INSURANCE | Admitting: Family Medicine

## 2022-03-03 ENCOUNTER — Other Ambulatory Visit: Payer: Self-pay | Admitting: Family

## 2022-03-03 DIAGNOSIS — K829 Disease of gallbladder, unspecified: Secondary | ICD-10-CM

## 2022-03-03 DIAGNOSIS — K224 Dyskinesia of esophagus: Secondary | ICD-10-CM

## 2022-03-03 DIAGNOSIS — K439 Ventral hernia without obstruction or gangrene: Secondary | ICD-10-CM

## 2022-04-06 ENCOUNTER — Other Ambulatory Visit: Payer: Self-pay | Admitting: Family

## 2022-04-07 MED ORDER — PANTOPRAZOLE SODIUM 40 MG PO TBEC: 40.0000 mg | DELAYED_RELEASE_TABLET | Freq: Two times a day (BID) | ORAL | 0 refills | Status: DC

## 2022-07-11 ENCOUNTER — Encounter: Payer: Self-pay | Admitting: Family Medicine

## 2022-07-11 DIAGNOSIS — I10 Essential (primary) hypertension: Secondary | ICD-10-CM

## 2022-07-11 MED ORDER — METOPROLOL SUCCINATE ER 50 MG PO TB24: 50.0000 mg | ORAL_TABLET | Freq: Every day | ORAL | 0 refills | Status: DC

## 2022-07-24 ENCOUNTER — Other Ambulatory Visit: Payer: Self-pay | Admitting: Family Medicine

## 2022-07-24 DIAGNOSIS — I10 Essential (primary) hypertension: Secondary | ICD-10-CM

## 2022-07-24 MED ORDER — METOPROLOL SUCCINATE ER 50 MG PO TB24: 50.0000 mg | ORAL_TABLET | Freq: Every day | ORAL | 0 refills | Status: DC

## 2022-07-24 NOTE — Addendum Note (Signed)
Addended by: Lamar Blinks C on: 07/24/2022 02:16 PM   Modules accepted: Orders

## 2022-11-14 NOTE — Progress Notes (Unsigned)
[facility] at [facility] [address], [address] [place], [place] [address] [phone]  Date:  11/16/2022   Name:  Meredith [name]   DOB:  Aug 08, 1990   MRN:  [id number]  PCP:  Pearline Cables, MD    Chief Complaint: No chief complaint on file.   History of Present Illness:  Meredith [name] is a 32 y.o. very pleasant female patient who presents with the following:  Patient seen today to discuss medication Most recent visit with myself was February 2023, she did see my partner Ria Clock September of last year as well History of hypertension, obesity, prediabetes  BP Readings from Last 3 Encounters:  02/18/22 (!) 130/90  07/22/21 124/80  05/06/20 (!) 150/90   Pap screening Can update labs if she would like  Patient Active Problem List   Diagnosis Date Noted   Prediabetes 05/06/2020   General medical examination 12/22/2010   LOM 01/26/2010    Past Medical History:  Diagnosis Date   Hypoglycemia     No past surgical history on file.  Social History   Tobacco Use   Smoking status: Never   Smokeless tobacco: Never  Substance Use Topics   Alcohol use: No   Drug use: No    Family History  Problem Relation Age of Onset   Hyperlipidemia Father     Allergies  Allergen Reactions   Penicillins Rash    Medication list has been reviewed and updated.  Current Outpatient Medications on File Prior to Visit  Medication Sig Dispense Refill   metoprolol succinate (TOPROL-XL) 50 MG 24 hr tablet Take 1 tablet (50 mg total) by mouth daily. Take with or immediately following a meal. 90 tablet 0   pantoprazole (PROTONIX) 40 MG tablet Take 1 tablet (40 mg total) by mouth 2 (two) times daily before a meal. 60 tablet 0   No current facility-administered medications on file prior to visit.    Review of Systems:  As per HPI- otherwise negative.   Physical Examination: There were no vitals filed for this visit. There were  no vitals filed for this visit. There is no height or weight on file to calculate BMI. Ideal Body Weight:    GEN: no acute distress. HEENT: Atraumatic, Normocephalic.  Ears and Nose: No external deformity. CV: RRR, No M/G/R. No JVD. No thrill. No extra heart sounds. PULM: CTA B, no wheezes, crackles, rhonchi. No retractions. No resp. distress. No accessory muscle use. ABD: S, NT, ND, +BS. No rebound. No HSM. EXTR: No c/c/e PSYCH: Normally interactive. Conversant.    Assessment and Plan: ***  Signed Abbe Amsterdam, MD

## 2022-11-16 ENCOUNTER — Ambulatory Visit (INDEPENDENT_AMBULATORY_CARE_PROVIDER_SITE_OTHER): Payer: Managed Care, Other (non HMO) | Admitting: Family Medicine

## 2022-11-16 VITALS — BP 120/80 | HR 79 | Temp 98.1°F | Resp 18 | Ht 64.0 in | Wt 299.6 lb

## 2022-11-16 DIAGNOSIS — R5383 Other fatigue: Secondary | ICD-10-CM | POA: Diagnosis not present

## 2022-11-16 DIAGNOSIS — K9041 Non-celiac gluten sensitivity: Secondary | ICD-10-CM

## 2022-11-16 DIAGNOSIS — I10 Essential (primary) hypertension: Secondary | ICD-10-CM

## 2022-11-16 DIAGNOSIS — Z131 Encounter for screening for diabetes mellitus: Secondary | ICD-10-CM

## 2022-11-16 DIAGNOSIS — Z13220 Encounter for screening for lipoid disorders: Secondary | ICD-10-CM | POA: Diagnosis not present

## 2022-11-16 DIAGNOSIS — D509 Iron deficiency anemia, unspecified: Secondary | ICD-10-CM

## 2022-11-16 DIAGNOSIS — Z1329 Encounter for screening for other suspected endocrine disorder: Secondary | ICD-10-CM

## 2022-11-16 DIAGNOSIS — Z6841 Body Mass Index (BMI) 40.0 and over, adult: Secondary | ICD-10-CM

## 2022-11-16 DIAGNOSIS — D72829 Elevated white blood cell count, unspecified: Secondary | ICD-10-CM

## 2022-11-16 MED ORDER — METOPROLOL SUCCINATE ER 50 MG PO TB24: 50.0000 mg | ORAL_TABLET | Freq: Every day | ORAL | 3 refills | Status: DC

## 2022-11-16 NOTE — Patient Instructions (Addendum)
I will be in touch with your labs You might consider looking into weight loss surgery as a more permanent solution -  go to this website to get signed up for the informational seminar  [email or web address]  I will refer you to GI to discuss possible testing for celiac disease- let me know if you have difficulty setting this up!

## 2022-11-17 ENCOUNTER — Telehealth: Payer: Self-pay | Admitting: *Deleted

## 2022-11-17 ENCOUNTER — Encounter: Payer: Self-pay | Admitting: Family Medicine

## 2022-11-17 LAB — TSH: TSH: 0.86 u[IU]/mL (ref 0.35–5.50)

## 2022-11-17 LAB — CBC
HCT: 36.7 % (ref 36.0–46.0)
Hemoglobin: 11.2 g/dL — ABNORMAL LOW (ref 12.0–15.0)
MCHC: 30.6 g/dL (ref 30.0–36.0)
MCV: 73.7 fl — ABNORMAL LOW (ref 78.0–100.0)
Platelets: 318 10*3/uL (ref 150.0–400.0)
RBC: 4.98 Mil/uL (ref 3.87–5.11)
RDW: 16.9 % — ABNORMAL HIGH (ref 11.5–15.5)
WBC: 11.9 10*3/uL — ABNORMAL HIGH (ref 4.0–10.5)

## 2022-11-17 LAB — LIPID PANEL
Cholesterol: 150 mg/dL (ref 0–200)
HDL: 37.9 mg/dL — ABNORMAL LOW (ref >39.00)
LDL Cholesterol: 93 mg/dL (ref 0–99)
NonHDL: 111.8
Total CHOL/HDL Ratio: 4
Triglycerides: 96 mg/dL (ref 0.0–149.0)
VLDL: 19.2 mg/dL (ref 0.0–40.0)

## 2022-11-17 LAB — COMPREHENSIVE METABOLIC PANEL
ALT: 13 U/L (ref 0–35)
AST: 13 U/L (ref 0–37)
Albumin: 4.4 g/dL (ref 3.5–5.2)
Alkaline Phosphatase: 60 U/L (ref 39–117)
BUN: 16 mg/dL (ref 6–23)
CO2: 25 mEq/L (ref 19–32)
Calcium: 9.5 mg/dL (ref 8.4–10.5)
Chloride: 102 mEq/L (ref 96–112)
Creatinine, Ser: 0.59 mg/dL (ref 0.40–1.20)
GFR: 120.18 mL/min (ref >60.00)
Glucose, Bld: 101 mg/dL — ABNORMAL HIGH (ref 70–99)
Potassium: 3.9 mEq/L (ref 3.5–5.1)
Sodium: 139 mEq/L (ref 135–145)
Total Bilirubin: 0.2 mg/dL (ref 0.2–1.2)
Total Protein: 7.3 g/dL (ref 6.0–8.3)

## 2022-11-17 LAB — HEMOGLOBIN A1C: Hgb A1c MFr Bld: 5.5 % (ref 4.6–6.5)

## 2022-11-17 LAB — VITAMIN D 25 HYDROXY (VIT D DEFICIENCY, FRACTURES): VITD: >120 ng/mL

## 2022-11-17 NOTE — Addendum Note (Signed)
Addended by: Abbe Amsterdam C on: 11/17/2022 08:50 PM   Modules accepted: Orders

## 2022-11-17 NOTE — Telephone Encounter (Signed)
CRITICAL VALUE STICKER  CRITICAL VALUE: vit d >120  Vernona Rieger notified and advised to send to PCP

## 2022-11-18 NOTE — Telephone Encounter (Signed)
Handled via mychart

## 2023-07-27 DIAGNOSIS — I10 Essential (primary) hypertension: Secondary | ICD-10-CM | POA: Insufficient documentation

## 2023-07-27 NOTE — Progress Notes (Signed)
 [facility] at [facility] [address] [place], [place] [address] [phone]  Date:  07/31/2023   Name:  Meredith [name]   DOB:  18-Dec-1990   MRN:  [id number]  PCP:  Pearline Cables, MD    Chief Complaint: sleep issues (Pt has concerns about having sleep apnea. She is constantly waking up to urinate. She has been checked for DM already. She only sleeps for 4-5 hours per night. She is always tired. )   History of Present Illness:  Meredith [name] is a 33 y.o. very pleasant female patient who presents with the following:  Pt seen today with concern of difficulty sleeping Last seen by myself in June: Patient seen today for follow-up.  Blood pressures under good control.  I encouraged her to bring in her blood pressure cuff so we can check it for accuracy at her convenience Follow-up on lab work as above Discussed obesity and difficulty with weight loss.  From patient's report, she has made a good effort and I would have expected her to experience more weight loss from what she has done so far.  She has always struggled with her weight and notes her body habitus is different from most members of her family.  We discussed weight loss surgery some today, she is starting to consider having this done.  I do think she would be a good candidate for this surgery.  She will look into attending the free online seminar Will plan further follow- up pending labs.  She notes a hard time with sleep for about a month She has a hard time getting to sleep and is waking up frequently- she will often need to urinate at night She may sleep 1-2 hours at a time and then wake up She does not note the urinary frequency during the day She is not aware of snoring in the past but her mom has noted her snoring some recently Never had a sleep test   She is off gluten and this has helped with her headaches In the past she would have headaches in the am- these  were better but recently coming back again She notes low energy for several years She will feel very tired and achy in the am when she first gets up   She notes LMP a week ago   Lab Results  Component Value Date   HGBA1C 5.7 07/31/2023     Patient Active Problem List   Diagnosis Date Noted   Essential hypertension 07/27/2023   Prediabetes 05/06/2020   General medical examination 12/22/2010   LOM 01/26/2010    Past Medical History:  Diagnosis Date   Hypoglycemia     No past surgical history on file.  Social History   Tobacco Use   Smoking status: Never   Smokeless tobacco: Never  Substance Use Topics   Alcohol use: No   Drug use: No    Family History  Problem Relation Age of Onset   Hyperlipidemia Father     Allergies  Allergen Reactions   Penicillins Rash    Medication list has been reviewed and updated.  No current outpatient medications on file prior to visit.   No current facility-administered medications on file prior to visit.    Review of Systems:  As per HPI- otherwise negative.   Physical Examination: Vitals:   07/31/23 1438  BP: 124/82  Pulse: 93  Resp: 18  Temp: 98 F (36.7  C)  SpO2: 99%   Vitals:   07/31/23 1438  Weight: (!) 302 lb (137 kg)  Height: 5\' 4"  (1.626 m)   Body mass index is 51.84 kg/m. Ideal Body Weight: Weight in (lb) to have BMI = 25: 145.3  GEN: no acute distress.  Obese, looks well HEENT: Atraumatic, Normocephalic.  Bilateral TM wnl, oropharynx normal except she has large tonsils bilaterally, PEERL,EOMI.   Ears and Nose: No external deformity. CV: RRR, No M/G/R. No JVD. No thrill. No extra heart sounds. PULM: CTA B, no wheezes, crackles, rhonchi. No retractions. No resp. distress. No accessory muscle use. EXTR: No c/c/e PSYCH: Normally interactive. Conversant.    Assessment and Plan: Essential hypertension - Plan: CBC w/Diff  Hypertension, unspecified type - Plan: metoprolol succinate (TOPROL-XL) 50  MG 24 hr tablet, DISCONTINUED: metoprolol succinate (TOPROL-XL) 50 MG 24 hr tablet  Paradoxical insomnia - Plan: traZODone (DESYREL) 50 MG tablet, DISCONTINUED: traZODone (DESYREL) 50 MG tablet  Somnolence - Plan: Ambulatory referral to Neurology, Vitamin B12, TSH  Urinary frequency - Plan: Hemoglobin A1c, Urine Culture  Leukocytosis, unspecified type - Plan: CBC w/Diff  Patient seen today with concern about her sleep.  She notes she has a hard time falling asleep and tends to wake up frequently during the night.  She will feel quite tired in the morning and in the afternoon, but she is a "night owl" and has a harder time going to bed in the evening.  We wonder if she may have sleep apnea.  She would like to have a sleep study which we will arrange.  She does have risk factors for sleep apnea including obesity and large tonsils.  I also prescribed trazodone for her to try as a sleep aid.  Brittan also notes she just feels tired, will check a B12 and thyroid for her. She notes urinary frequency really just at night.  Urine culture, A1c pending  History of leukocytosis, check CBC  Signed Abbe Amsterdam, MD  Addendum 2/18, received labs as below.  Message to patient  Results for orders placed or performed in visit on 07/31/23  Urine Culture   Collection Time: 07/31/23  3:11 PM   Specimen: Urine  Result Value Ref Range   MICRO NUMBER: [id number]    SPECIMEN QUALITY: Adequate    Sample Source URINE    STATUS: FINAL    Result: No Growth   Hemoglobin A1c   Collection Time: 07/31/23  3:11 PM  Result Value Ref Range   Hgb A1c MFr Bld 5.7 4.6 - 6.5 %  CBC w/Diff   Collection Time: 07/31/23  3:11 PM  Result Value Ref Range   WBC 11.5 (H) 3.8 - 10.8 Thousand/uL   RBC 4.91 3.80 - 5.10 Million/uL   Hemoglobin 12.1 11.7 - 15.5 g/dL   HCT 30.8 65.7 - 84.6 %   MCV 77.8 (L) 80.0 - 100.0 fL   MCH 24.6 (L) 27.0 - 33.0 pg   MCHC 31.7 (L) 32.0 - 36.0 g/dL   RDW 96.2 95.2 - 84.1 %   Platelets  321 140 - 400 Thousand/uL   MPV 11.8 7.5 - 12.5 fL   Neutro Abs 6,521 1,500 - 7,800 cells/uL   Absolute Lymphocytes 4,060 (H) 850 - 3,900 cells/uL   Absolute Monocytes 771 200 - 950 cells/uL   Eosinophils Absolute 92 15 - 500 cells/uL   Basophils Absolute 58 0 - 200 cells/uL   Neutrophils Relative % 56.7 %   Total Lymphocyte 35.3 %   Monocytes  Relative 6.7 %   Eosinophils Relative 0.8 %   Basophils Relative 0.5 %  Vitamin B12   Collection Time: 07/31/23  3:11 PM  Result Value Ref Range   Vitamin B-12 379 211 - 911 pg/mL  TSH   Collection Time: 07/31/23  3:11 PM  Result Value Ref Range   TSH 0.99 0.35 - 5.50 uIU/mL   addendum 2/19, received ferritin and urine cultures-message to patient    Component Ref Range & Units (hover) 1 d ago  Ferritin 6.5 Low

## 2023-07-31 ENCOUNTER — Ambulatory Visit (INDEPENDENT_AMBULATORY_CARE_PROVIDER_SITE_OTHER): Payer: Managed Care, Other (non HMO) | Admitting: Family Medicine

## 2023-07-31 VITALS — BP 124/82 | HR 93 | Temp 98.0°F | Resp 18 | Ht 64.0 in | Wt 302.0 lb

## 2023-07-31 DIAGNOSIS — R400 Somnolence: Secondary | ICD-10-CM | POA: Diagnosis not present

## 2023-07-31 DIAGNOSIS — R350 Frequency of micturition: Secondary | ICD-10-CM

## 2023-07-31 DIAGNOSIS — D72829 Elevated white blood cell count, unspecified: Secondary | ICD-10-CM

## 2023-07-31 DIAGNOSIS — I10 Essential (primary) hypertension: Secondary | ICD-10-CM

## 2023-07-31 DIAGNOSIS — F5103 Paradoxical insomnia: Secondary | ICD-10-CM | POA: Diagnosis not present

## 2023-07-31 MED ORDER — TRAZODONE HCL 50 MG PO TABS: 25.0000 mg | ORAL_TABLET | Freq: Every evening | ORAL | 3 refills | Status: DC | PRN

## 2023-07-31 MED ORDER — METOPROLOL SUCCINATE ER 50 MG PO TB24: 50.0000 mg | ORAL_TABLET | Freq: Every day | ORAL | 3 refills | Status: AC

## 2023-07-31 MED ORDER — METOPROLOL SUCCINATE ER 50 MG PO TB24: 50.0000 mg | ORAL_TABLET | Freq: Every day | ORAL | 3 refills | Status: DC

## 2023-07-31 MED ORDER — TRAZODONE HCL 50 MG PO TABS
25.0000 mg | ORAL_TABLET | Freq: Every evening | ORAL | 3 refills | Status: DC | PRN
Start: 2023-07-31 — End: 2023-07-31

## 2023-07-31 NOTE — Patient Instructions (Signed)
 Good to see you today I will be in touch with your labs Try the trazodone for insomnia- start with a 1/2 tablet, increase to a whole if needed We will set you up for a sleep evaluation

## 2023-08-01 ENCOUNTER — Other Ambulatory Visit: Payer: Self-pay | Admitting: *Deleted

## 2023-08-01 ENCOUNTER — Ambulatory Visit (INDEPENDENT_AMBULATORY_CARE_PROVIDER_SITE_OTHER): Payer: Managed Care, Other (non HMO)

## 2023-08-01 ENCOUNTER — Encounter: Payer: Self-pay | Admitting: Family Medicine

## 2023-08-01 DIAGNOSIS — R718 Other abnormality of red blood cells: Secondary | ICD-10-CM

## 2023-08-01 LAB — CBC WITH DIFFERENTIAL/PLATELET
Absolute Lymphocytes: 4060 {cells}/uL — ABNORMAL HIGH (ref 850–3900)
Absolute Monocytes: 771 {cells}/uL (ref 200–950)
Basophils Absolute: 58 {cells}/uL (ref 0–200)
Basophils Relative: 0.5 %
Eosinophils Absolute: 92 {cells}/uL (ref 15–500)
Eosinophils Relative: 0.8 %
HCT: 38.2 % (ref 35.0–45.0)
Hemoglobin: 12.1 g/dL (ref 11.7–15.5)
MCH: 24.6 pg — ABNORMAL LOW (ref 27.0–33.0)
MCHC: 31.7 g/dL — ABNORMAL LOW (ref 32.0–36.0)
MCV: 77.8 fL — ABNORMAL LOW (ref 80.0–100.0)
MPV: 11.8 fL (ref 7.5–12.5)
Monocytes Relative: 6.7 %
Neutro Abs: 6521 {cells}/uL (ref 1500–7800)
Neutrophils Relative %: 56.7 %
Platelets: 321 10*3/uL (ref 140–400)
RBC: 4.91 10*6/uL (ref 3.80–5.10)
RDW: 14.7 % (ref 11.0–15.0)
Total Lymphocyte: 35.3 %
WBC: 11.5 10*3/uL — ABNORMAL HIGH (ref 3.8–10.8)

## 2023-08-01 LAB — FERRITIN: Ferritin: 6.5 ng/mL — ABNORMAL LOW (ref 10.0–291.0)

## 2023-08-01 LAB — HEMOGLOBIN A1C: Hgb A1c MFr Bld: 5.7 % (ref 4.6–6.5)

## 2023-08-01 LAB — URINE CULTURE
MICRO NUMBER:: 16092664
Result:: NO GROWTH
SPECIMEN QUALITY:: ADEQUATE

## 2023-08-01 LAB — VITAMIN B12: Vitamin B-12: 379 pg/mL (ref 211–911)

## 2023-08-01 LAB — TSH: TSH: 0.99 u[IU]/mL (ref 0.35–5.50)

## 2023-08-02 ENCOUNTER — Encounter: Payer: Self-pay | Admitting: Family Medicine

## 2023-08-22 ENCOUNTER — Encounter: Payer: Self-pay | Admitting: Neurology

## 2023-08-22 ENCOUNTER — Ambulatory Visit (INDEPENDENT_AMBULATORY_CARE_PROVIDER_SITE_OTHER): Payer: Managed Care, Other (non HMO) | Admitting: Neurology

## 2023-08-22 VITALS — BP 143/93 | HR 88 | Ht 64.0 in | Wt 302.5 lb

## 2023-08-22 DIAGNOSIS — E662 Morbid (severe) obesity with alveolar hypoventilation: Secondary | ICD-10-CM | POA: Diagnosis not present

## 2023-08-22 DIAGNOSIS — R519 Headache, unspecified: Secondary | ICD-10-CM | POA: Diagnosis not present

## 2023-08-22 DIAGNOSIS — Z9189 Other specified personal risk factors, not elsewhere classified: Secondary | ICD-10-CM | POA: Diagnosis not present

## 2023-08-22 DIAGNOSIS — R0683 Snoring: Secondary | ICD-10-CM | POA: Insufficient documentation

## 2023-08-22 MED ORDER — ALPRAZOLAM 0.25 MG PO TABS: 0.2500 mg | ORAL_TABLET | Freq: Every evening | ORAL | 0 refills | Status: DC | PRN

## 2023-08-22 NOTE — Progress Notes (Signed)
 SLEEP MEDICINE CLINIC    Provider:  Melvyn Novas, MD  Primary Care Physician:  Pearline Cables, MD [address] [place] [address]     Referring Provider: Pearline Cables, Md [address] [place],  [place] [address]          Chief Complaint according to patient   Patient presents with:     New Patient (Initial Visit)           HISTORY OF PRESENT ILLNESS:  Meredith [name] is a 33 y.o. female patient who is seen upon referral on 08/22/2023 from PCP for a Sleep consultation. .  Chief concern according to patient :  " I have been getting 4-6 hours of sleep without feeling rested, I have woken with headaches , but don't wake me up from sleep, HA resolve without medications. I have difficulties falling asleep and get up Hourly  to the bathroom ,  up to 5 bathroom breaks, some times I will stay awake  after those  nocturias when I feel anxious.  Started 3-4 months ago was the onset of these symptoms, I am not daytime sleepy, I am just extremely fatigued.  I used not to snore at all and that started  recently".    I have the pleasure of seeing [name] 08/22/23 a right-handed female with a possible sleep disorder.       Sleep relevant medical history: Nocturia/ used to have daily HA but improved after  introducing a gluten free diet- ,  No ENT surgery-  wisdom tooth removed, obesity since age 45-11, lost 80 pounds in 2018 but stopped exercising with a knee injury, "    Family medical /sleep history: Father is a snorer, 2 healthy siblings who snore .    Social history:  Patient is working as Comptroller / Paramedic in alcohol dependency . She  lives in a household with parents and a brother, 2 dogs -  Family status is single .  Tobacco use; None .   ETOH use ;  none ,  Caffeine intake ;  has cut it out - it interferes with sleep-' Exercise : no routine.  Hobbies : reading !       Sleep habits are as follows:  The patient's  dinner time is between 5.30-6  PM. The patient goes to bed at 9.30 PM and struggles to fall asleep- will usually be asleep by 11 PM, Bedroom is cool, quiet  dark, but cell phone is used late at night.  continues to sleep for 5-7 hours, wakes for many, many  bathroom breaks, the first time at 1 AM.   The preferred sleep position is prone - lateral now- restless ,  with the support of 2 pillows. Flat bed . Dreams are reportedly infrequent/vivid.   The patient wakes up spontaneously some days , some times with an alarm. 7  AM is the usual rise time. She reports not feeling refreshed or restored in AM, with symptoms such as dry mouth( frequent ) , morning headaches( frequent ) , and residual fatigue.  Naps are taken infrequently, lasting from 10 to 20 minutes - and she needs to be with her things in her room -in her bed-  a touch of OCD.   Review of Systems: Out of a complete 14 system review, the patient complains of only the following symptoms, and all other reviewed systems are negative.:  Fatigue,  sleepiness , snoring,  fragmented sleep, Insomnia, - Nocturia is frequent    How likely are you to doze in the following situations: 0 = not likely, 1 = slight chance, 2 = moderate chance, 3 = high chance   Sitting and Reading? Watching Television? Sitting inactive in a public place (theater or meeting)? As a passenger in a car for an hour without a break? Lying down in the afternoon when circumstances permit? Sitting and talking to someone? Sitting quietly after lunch without alcohol? In a car, while stopped for a few minutes in traffic?   Total = 1/ 24 points   FSS endorsed at 53/ 63 points.   GDS / 0/ 15 , denies depression,   but a lot of anxiety  GAD.   Social History   Socioeconomic History   Marital status: Single    Spouse name: Not on file   Number of children: Not on file   Years of education: Not on file   Highest education level: Master's degree (e.g., MA, MS, MEng, MEd,  MSW, MBA)  Occupational History   Not on file  Tobacco Use   Smoking status: Never   Smokeless tobacco: Never  Vaping Use   Vaping status: Never Used  Substance and Sexual Activity   Alcohol use: No   Drug use: No   Sexual activity: Not on file  Other Topics Concern   Not on file  Social History Narrative   Not on file   Social Drivers of Health   Financial Resource Strain: Low Risk  (07/29/2023)   Overall Financial Resource Strain (CARDIA)    Difficulty of Paying Living Expenses: Not very hard  Food Insecurity: No Food Insecurity (07/29/2023)   Hunger Vital Sign    Worried About Running Out of Food in the Last Year: Never true    Ran Out of Food in the Last Year: Never true  Transportation Needs: No Transportation Needs (07/29/2023)   PRAPARE - Administrator, Civil Service (Medical): No    Lack of Transportation (Non-Medical): No  Physical Activity: Insufficiently Active (07/29/2023)   Exercise Vital Sign    Days of Exercise per Week: 3 days    Minutes of Exercise per Session: 30 min  Stress: Stress Concern Present (07/29/2023)   [employer] of Occupational Health - Occupational Stress Questionnaire    Feeling of Stress : Very much  Social Connections: Socially Isolated (07/29/2023)   Social Connection and Isolation Panel [NHANES]    Frequency of Communication with Friends and Family: More than three times a week    Frequency of Social Gatherings with Friends and Family: More than three times a week    Attends Religious Services: Never    Database administrator or Organizations: No    Attends Engineer, structural: Not on file    Marital Status: Never married    Family History  Problem Relation Age of Onset   Hyperlipidemia Father     Past Medical History:  Diagnosis Date   Hypoglycemia     History reviewed. No pertinent surgical history.   Current Outpatient Medications on File Prior to Visit  Medication Sig Dispense Refill    metoprolol succinate (TOPROL-XL) 50 MG 24 hr tablet Take 1 tablet (50 mg total) by mouth daily. Take with or immediately following a meal. 90 tablet 3   traZODone (DESYREL) 50 MG tablet Take 0.5-1 tablets (25-50 mg total) by mouth at bedtime as needed for sleep. 30 tablet 3  No current facility-administered medications on file prior to visit.    Allergies  Allergen Reactions   Penicillins Rash     DIAGNOSTIC DATA (LABS, IMAGING, TESTING) - I reviewed patient records, labs, notes, testing and imaging myself where available.  Lab Results  Component Value Date   WBC 11.5 (H) 07/31/2023   HGB 12.1 07/31/2023   HCT 38.2 07/31/2023   MCV 77.8 (L) 07/31/2023   PLT 321 07/31/2023      Component Value Date/Time   NA 139 11/16/2022 1407   K 3.9 11/16/2022 1407   CL 102 11/16/2022 1407   CO2 25 11/16/2022 1407   GLUCOSE 101 (H) 11/16/2022 1407   BUN 16 11/16/2022 1407   CREATININE 0.59 11/16/2022 1407   CREATININE 0.70 02/18/2022 1524   CALCIUM 9.5 11/16/2022 1407   PROT 7.3 11/16/2022 1407   ALBUMIN 4.4 11/16/2022 1407   AST 13 11/16/2022 1407   ALT 13 11/16/2022 1407   ALKPHOS 60 11/16/2022 1407   BILITOT 0.2 11/16/2022 1407   Lab Results  Component Value Date   CHOL 150 11/16/2022   HDL 37.90 (L) 11/16/2022   LDLCALC 93 11/16/2022   TRIG 96.0 11/16/2022   CHOLHDL 4 11/16/2022   Lab Results  Component Value Date   HGBA1C 5.7 07/31/2023   Lab Results  Component Value Date   VITAMINB12 379 07/31/2023   Lab Results  Component Value Date   TSH 0.99 07/31/2023    PHYSICAL EXAM:  Today's Vitals   08/22/23 1511 08/22/23 1518  BP: (!) 150/96 (!) 143/93  Pulse: 88   Weight: (!) 302 lb 8 oz (137.2 kg)   Height: 5\' 4"  (1.626 m)    Body mass index is 51.92 kg/m.   Wt Readings from Last 3 Encounters:  08/22/23 (!) 302 lb 8 oz (137.2 kg)  07/31/23 (!) 302 lb (137 kg)  11/16/22 299 lb 9.6 oz (135.9 kg)     Ht Readings from Last 3 Encounters:  08/22/23 5\' 4"   (1.626 m)  07/31/23 5\' 4"  (1.626 m)  11/16/22 5\' 4"  (1.626 m)      General: The patient is awake, alert and appears not in acute distress. The patient is well groomed. Head: Normocephalic, atraumatic. Neck is supple. Mallampati 1 very large tonsils - lateral touching the uvula- ,  neck circumference:17 inches .  Nasal airflow  patent.  Retrognathia is not seen.  Dental status: biological  Cardiovascular:  Regular rate and cardiac rhythm by pulse,  without distended neck veins. Respiratory: Lungs are clear to auscultation.  Skin:  Without evidence of ankle edema, or rash. Trunk: The patient's posture is relaxed, she has torsal and abdominal girth    NEUROLOGIC EXAM: The patient is awake and alert, oriented to place and time.   Memory subjective described as intact.  Attention span & concentration ability appears normal.  Speech is fluent,  without  dysarthria, dysphonia or aphasia.  Mood and affect are appropriate.   Cranial nerves: no loss of smell or taste reported  Pupils are equal and briskly reactive to light. Funduscopic exam deferred. .  Extraocular movements in vertical and horizontal planes were intact and without nystagmus. No Diplopia. Visual fields by finger perimetry are intact. Hearing was intact to soft voice and finger rubbing.    Facial sensation intact to fine touch.  Facial motor strength is symmetric and tongue and uvula move midline.  Neck ROM : rotation, tilt and flexion extension were normal for age and shoulder shrug was  symmetrical.    Motor exam:  Symmetric bulk, tone and ROM.   Normal tone without cog- wheeling, symmetric grip strength .   Sensory:  Fine touch  and vibration were tested  and  normal.  Proprioception tested in the upper extremities was normal.   Coordination: Rapid alternating movements in the fingers/hands were of normal speed.  The Finger-to-nose maneuver was intact without evidence of ataxia, dysmetria or tremor.   Gait and  station: Patient could rise unassisted from a seated position, walked without assistive device.  Stance is of wider  width/ base and the patient  was observed to turn with 3 steps.  Toe and heel walk were deferred.  Deep tendon reflexes: in the  upper and lower extremities are symmetric and intact.  Babinski response was deferred . l     ASSESSMENT AND PLAN 33 y.o. year old female  here with:    1) recent , rather sudden onset of f nocturia,  urinary frequency, not urge frequency . It may not be nocturia to wake her  , she may wake up for other reasons.   2) recently developed snoring or the awareness thereof.  The morning headaches and frequent awakenings with nocturia can be related to anxiety and to possible OSA.   3)  risk factors for OSA are BMI, Neck size, large tonsils,   40 insomnia can be related to traces of OCD ( everything has to be just right to fall asleep, raising the bar too high ) . Needs to leave the phone in the  kitchen , don't look at the clock. Etc.     I like to use an In lab study to order a SPLIT night at AHI 20/h  we are looking for OSA, looking for  hypoxia, Obesity hypoventilation :   If [employer] doesn't allow a PSG, will go for HST .   Melatonin for sleep aid 3-5 mg  at night.   Hot shower or bath before bedtime.    Reduce fluid intake 3 hours before bedtime,    I plan to follow up either personally or through our NP within 3-5  months.   I would like to thank Copland, Gwenlyn Found, MD and Copland, Gwenlyn Found, Md [address] [place],  [place] [address] for allowing me to meet with and to take care of this pleasant patient.     After spending a total time of  35  minutes face to face and additional time for physical and neurologic examination, review of laboratory studies,  personal review of imaging studies, reports and results of other testing and review of referral information / records as far as provided in visit,   Electronically  signed by: Melvyn Novas, MD 08/22/2023 3:47 PM  [facility] Board certified by The [employer] of Sleep Medicine and Diplomate of the [employer] of Sleep Medicine. Board certified In Neurology through the ABPN, Fellow of the [employer] of Neurology.

## 2023-08-22 NOTE — Patient Instructions (Signed)
 I ordered an in lab study  ( polysomnography or PSG ) and some XANAX for you in case you can't initiate sleep- should the in lab study be denied, we will instead use  HOME SLEEP TEST to screen for OSA, referred to as HST.

## 2023-08-22 NOTE — Addendum Note (Signed)
 Addended by: Melvyn Novas on: 08/22/2023 04:22 PM   Modules accepted: Orders

## 2023-08-24 ENCOUNTER — Telehealth: Payer: Self-pay | Admitting: Neurology

## 2023-08-24 NOTE — Telephone Encounter (Signed)
 Split [employer] pending.

## 2023-08-29 NOTE — Telephone Encounter (Signed)
 Split [employer] auth: [id number] (exp. 08/28/23 to 11/26/23)

## 2023-08-30 NOTE — Telephone Encounter (Signed)
 I spoke with the patient.  Split [employer] auth: [id number] (exp. 08/28/23 to 11/26/23) per Dr. Vickey Huger patient is aware to bring the Xanax & to have bed 2   She is scheduled at [facility] for 415/25 at 9 pm.  Mailed packet to the patient & sent mychart.

## 2023-09-26 ENCOUNTER — Ambulatory Visit (INDEPENDENT_AMBULATORY_CARE_PROVIDER_SITE_OTHER): Admitting: Neurology

## 2023-09-26 DIAGNOSIS — R0683 Snoring: Secondary | ICD-10-CM | POA: Diagnosis not present

## 2023-09-26 DIAGNOSIS — Z9189 Other specified personal risk factors, not elsewhere classified: Secondary | ICD-10-CM

## 2023-09-26 DIAGNOSIS — R519 Headache, unspecified: Secondary | ICD-10-CM

## 2023-09-26 DIAGNOSIS — E662 Morbid (severe) obesity with alveolar hypoventilation: Secondary | ICD-10-CM

## 2023-09-26 DIAGNOSIS — G4733 Obstructive sleep apnea (adult) (pediatric): Secondary | ICD-10-CM

## 2023-10-09 ENCOUNTER — Encounter: Payer: Self-pay | Admitting: Neurology

## 2023-10-09 NOTE — Procedures (Signed)
 [facility] POLYSOMNOGRAPHY  INTERPRETATION REPORT   STUDY DATE:  09/26/2023     PATIENT NAME:  Meredith [name]         DATE OF BIRTH:  1991/03/21  PATIENT ID:  [id number]    TYPE OF STUDY:  PSG  READING PHYSICIAN: Neomia Banner, MD REFERRED BY: Dr Geralyn Knee, MD  SCORING TECHNICIAN: Auston Left, RPSGT   HISTORY:  Meredith [name]  is 34 year-old Female patient  who was seen on 08-22-2023   and has a chief complaint of sleep related headaches, poor sleep quality, extreme fatigue- but no ability to sleep in daytime, no r urge to sleep-  and when she sleeps it is a non -restorative sleep. the patient is s considered morbidly obese with a BMI of > 50 .   There is a concern of insomnia that was addressed with the patient in the consultation.  High anxiety. The patient was provided prescriptions for Xanax  and trazodone  for the SPLIT sleep study (to bring with her and allow a valid sleep time to be recorded) , the patient did not take either.  ADDITIONAL INFORMATION:  The Epworth Sleepiness Scale endorsed at 1 /24 points (scores above or equal to 10 are suggestive of hypersomnolence). FSS endorsed at 53 /63 points. Depression score 0/ 15 points,   Height: 64 in Weight: 302 lb (BMI 51) Neck Size: 17 in  MEDICATIONS: Toprol  - XL, Desyrel  was prescribed but not brought to the sleep lab, Xanax  was ordered for the SPLIT protocol sleep study but was not taken.  TECHNICAL DESCRIPTION: A registered sleep technologist ( RPSGT)  was in attendance for the duration of the recording.  Data collection, scoring, video monitoring, and reporting were performed in compliance with the AASM Manual for the Scoring of Sleep and Associated Events . SLEEP CONTINUITY AND SLEEP ARCHITECTURE:  Lights-out was at 22:05: and lights-on at  05:09:, with  7.1 hours of recording time .  Total sleep time ( TST) was 92.0 minutes (!!!)  with a decreased sleep efficiency at 21.7% (!!) . There was 0.0%  REM sleep.  BODY POSITION:  TST was divided  between the following sleep positions: 0.0% supine;  100.0% lateral;  0% prone. Sleep in non-supine was seen for 92 minutes (100%);  on the right 81 minutes (89%), on the left 10 minutes (11%), and prone 00 minutes (0%).   Sleep latency was increased at 68.5 minutes.   Of the total sleep time, the percentage of stage N1 sleep was 16.8%, stage N2 sleep was 58%, stage N3 sleep was 25.5%, and REM sleep was 0.0%. Wake after sleep onset (WASO) time accounted for264 minutes (!) .   RESPIRATORY MONITORING:  Based on AASM criteria (using a 3% oxygen desaturation and /or arousal rule for scoring hypopneas), there were 0 apneas (0 obstructive; 0 central; 0 mixed), and 77 hypopneas.  Apnea index was 0.0. Hypopnea index was 50.2/h. The apnea-hypopnea index was 50.2/h overall (0.0 supine, 0 non-supine; 0.0 REM, 0.0 supine REM). OXIMETRY: Oxyhemoglobin Saturation Nadir during sleep was at  87% from a mean of 93%.  Of the Total sleep time (TST) hypoxemia (=<88%) was present for 0.5 minutes, or 0.5% of total sleep time.  LIMB MOVEMENTS: There were 0 periodic limb movements of sleep (0.0/hr), of which 0 (0.0/hr) were associated with an arousal. AROUSAL: There were 43 arousals in total, for an arousal index of 25 arousals/hour.  Of these, 29 were identified as respiratory-related arousals (19 /  h), 0 were PLM-related arousals (0 /h), and 14 were non-specific arousals (9 /h). There were 0 occurrences of Cheyne Stokes breathing.   EEG:  PSG EEG was of normal amplitude and frequency, with symmetric manifestation of sleep stages. EKG: The electrocardiogram documented NSR.  The average heart rate during sleep was 82 bpm.  The heart rate during sleep varied between a minimum of 74  and  a maximum of  91 bpm. AUDIO and VIDEO:   IMPRESSION: The attempt at a split night titration study failed due to  prolonged sleep latency and Insomnia , the patient never took the asleep aid as  provided specifically for this situation.  Sleep disordered breathing was present at an AHI of 50.2/h. This Obstructive Sleep hypopnea was seen at a severe degree, associated with arousals.   Total sleep time was severely reduced to only 92.0 minutes.  Sleep efficiency was decreased at 21.7%.  The study has a limited validity and could not be split due to short sleep time recorded- a situation we had wanted to prevent by prescribing a sleep aid specifically for the night in the sleep lab.   RECOMMENDATIONS:  I recommend to start PAP treatment for this patient by home titration.  This study shows limited sleep but hypopneas were seen , not apneas. This is a classic finding in Obesity hypoventilation.  In addition, weight loss needs to be addressed. A reduction in BMI by medical counseling, medication, dietary  or surgical means needs to be explored.   Given the high degree of Insomnia, I am not optimistic about compliance and effect of sleep apnea treatment translating into more and deeper sleep for this patient. Please refer for anxiety treatment to appropriate medical professional.   Neomia Banner,  MD            General Information  Name: Meredith [name], Meredith [name] BMI: 95.28 Physician: Neomia Banner, MD  ID: [id number] Height: 64.0 in Technician: Auston Left, RPSGT  Sex: Female Weight: 302.0 lb Record: [id number]  Age: 80 [1990/07/30] Date: 09/26/2023    Medical & Medication History    Meredith [name] is a 33 y.o. female patient who is seen upon referral on 08/22/2023 from PCP for a Sleep consultation. . Chief concern according to patient : " I have been getting 4-6 hours of sleep without feeling rested, I have woken with headaches , but don't wake me up from sleep, HA resolve without medications. I have difficulties falling asleep and get up Hourly to the bathroom , up to 5 bathroom breaks, some times I will stay awake after those nocturias when I feel anxious. Started 3-4 months ago was  the onset of these symptoms, I am not daytime sleepy, I am just extremely fatigued. I used not to snore at all and that started recently". I have the pleasure of seeing Meredith [name] 08/22/23 a right-handed female with a possible sleep disorder. Sleep relevant medical history: Nocturia/ used to have daily HA but improved after introducing a gluten free diet- , No ENT surgery- wisdom tooth removed, obesity since age 42-11, lost 80 pounds in 2018 but stopped exercising with a knee injury, "  Toprol  - XL, Desyrel    Sleep Disorder      Comments   The patient came into the lab for a PSG. The patient did not take Trazodone  or Melatonin. Also, the patient did not take Xanax  that was prescribed by ordering MD. The patient was informed that if she took the Xanax  it would need  to be taken by 11 PM. The patient verbalized understanding however, she never took the Xanax . The patient was not split due to not having enough TST. The patient had two restroom breaks. EKG showed no obvious cardiac arrhythmias. Mild snoring. Respiratory events scored with a 3% desat. Slept mostly lateral. The patient never reached 2 hrs of TST.     Lights out: 10:05:23 PM Lights on: 05:09:47 AM   Time Total Supine Side Prone Upright  Recording (TRT) 7h 4.67m 0h 54.23m 6h 10.35m 0h 0.76m 0h 0.40m  Sleep (TST) 1h 32.25m 0h 0.69m 1h 32.48m 0h 0.40m 0h 0.60m   Latency N1 N2 N3 REM Onset Per. Slp. Eff.  Actual 1h 8.22m 1h 31.29m 1h 55.72m 0h 0.45m 1h 8.60m 1h 48.46m 21.67%   Stg Dur Wake N1 N2 N3 REM  Total 262.5 15.5 53.0 23.5 0.0  Supine 13.0 0.0 0.0 0.0 0.0  Side 249.5 15.5 53.0 23.5 0.0  Prone 0.0 0.0 0.0 0.0 0.0  Upright 0.0 0.0 0.0 0.0 0.0   Stg % Wake N1 N2 N3 REM  Total 74.0 16.8 57.6 25.5 0.0  Supine 3.7 0.0 0.0 0.0 0.0  Side 70.4 16.8 57.6 25.5 0.0  Prone 0.0 0.0 0.0 0.0 0.0  Upright 0.0 0.0 0.0 0.0 0.0     Apnea Summary Sub Supine Side Prone Upright  Total 0 Total 0 0 0 0 0    REM 0 0 0 0 0    NREM 0 0 0 0 0  Obs 0 REM 0 0 0  0 0    NREM 0 0 0 0 0  Mix 0 REM 0 0 0 0 0    NREM 0 0 0 0 0  Cen 0 REM 0 0 0 0 0    NREM 0 0 0 0 0   Rera Summary Sub Supine Side Prone Upright  Total 0 Total 0 0 0 0 0    REM 0 0 0 0 0    NREM 0 0 0 0 0   Hypopnea Summary Sub Supine Side Prone Upright  Total 77 Total 77 0 77 0 0    REM 0 0 0 0 0    NREM 77 0 77 0 0   4% Hypopnea Summary Sub Supine Side Prone Upright  Total (4%) 63 Total 63 0 63 0 0    REM 0 0 0 0 0    NREM 63 0 63 0 0     AHI Total Obs Mix Cen  50.22 Apnea 0.00 0.00 0.00 0.00   Hypopnea 50.22 -- -- --  41.09 Hypopnea (4%) 41.09 -- -- --    Total Supine Side Prone Upright  Position AHI 50.22 0.00 50.22 0.00 0.00  REM AHI 0.00   NREM AHI 50.22   Position RDI 50.22 0.00 50.22 0.00 0.00  REM RDI 0.00   NREM RDI 50.22    4% Hypopnea Total Supine Side Prone Upright  Position AHI (4%) 41.09 0.00 41.09 0.00 0.00  REM AHI (4%) 0.00   NREM AHI (4%) 41.09   Position RDI (4%) 41.09 0.00 41.09 0.00 0.00  REM RDI (4%) 0.00   NREM RDI (4%) 41.09    Desaturation Information Threshold: 2% <100% <90% <80% <70% <60% <50% <40%  Supine 5.0 0.0 0.0 0.0 0.0 0.0 0.0  Side 289.0 20.0 0.0 0.0 0.0 0.0 0.0  Prone 0.0 0.0 0.0 0.0 0.0 0.0 0.0  Upright 0.0 0.0 0.0 0.0 0.0 0.0 0.0  Total 294.0  20.0 0.0 0.0 0.0 0.0 0.0  Index 49.8 3.4 0.0 0.0 0.0 0.0 0.0   Threshold: 3% <100% <90% <80% <70% <60% <50% <40%  Supine 4.0 0.0 0.0 0.0 0.0 0.0 0.0  Side 212.0 20.0 0.0 0.0 0.0 0.0 0.0  Prone 0.0 0.0 0.0 0.0 0.0 0.0 0.0  Upright 0.0 0.0 0.0 0.0 0.0 0.0 0.0  Total 216.0 20.0 0.0 0.0 0.0 0.0 0.0  Index 36.6 3.4 0.0 0.0 0.0 0.0 0.0   Threshold: 4% <100% <90% <80% <70% <60% <50% <40%  Supine 2.0 0.0 0.0 0.0 0.0 0.0 0.0  Side 150.0 20.0 0.0 0.0 0.0 0.0 0.0  Prone 0.0 0.0 0.0 0.0 0.0 0.0 0.0  Upright 0.0 0.0 0.0 0.0 0.0 0.0 0.0  Total 152.0 20.0 0.0 0.0 0.0 0.0 0.0  Index 25.7 3.4 0.0 0.0 0.0 0.0 0.0   Threshold: 4% <100% <90% <80% <70% <60% <50% <40%  Supine 2 0 0 0 0 0 0   Side 150 20 0 0 0 0 0  Prone 0 0 0 0 0 0 0  Upright 0 0 0 0 0 0 0  Total 152 20 0 0 0 0 0   Awakening/Arousal Information # of Awakenings 13  Wake after sleep onset 264.38m  Wake after persistent sleep 237.41m   Arousal Assoc. Arousals Index  Apneas 0 0.0  Hypopneas 29 18.9  Leg Movements 0 0.0  Snore 0 0.0  PTT Arousals 0 0.0  Spontaneous 14 9.1  Total 43 28.0  Leg Movement Information PLMS LMs Index  Total LMs during PLMS 0 0.0  LMs w/ Microarousals 0 0.0   LM LMs Index  w/ Microarousal 0 0.0  w/ Awakening 0 0.0  w/ Resp Event 0 0.0  Spontaneous 0 0.0  Total 0 0.0     Desaturation threshold setting: 4% Minimum desaturation setting: 10 seconds SaO2 nadir: 67% The longest event was a 43 sec obstructive Hypopnea with a minimum SaO2 of 92%. The lowest SaO2 was 87% associated with a 16 sec obstructive Hypopnea. EKG Rates EKG Avg Max Min  Awake 85 109 75  Asleep 82 91 74

## 2023-10-09 NOTE — Addendum Note (Signed)
 Addended by: Neomia Banner on: 10/09/2023 01:01 PM   Modules accepted: Orders

## 2023-10-10 NOTE — Telephone Encounter (Signed)
-----   Message from [place] Dohmeier sent at 10/09/2023  1:01 PM EDT ----- The attempt at a split night titration study failed due to  prolonged sleep latency and Insomnia , the patient never took the asleep aid as provided specifically for this situation.  1. Sleep disordered breathing was present at an AHI of 50.2/h. This Obstructive Sleep hypopnea was seen at a severe degree, associated with arousals.   2. Total sleep time was severely reduced to only 92.0 minutes.  Sleep efficiency was decreased at 21.7%.  The study has a limited validity and could not be split due to short sleep time recorded- a situation we had wanted to prevent by prescribing a sleep aid specifically for the night in the sleep lab.   I recommend to start PAP treatment for this patient by home titration.  This study shows limited sleep but hypopneas were seen , not apneas. This is a classic finding in Obesity hypoventilation.  In addition, weight loss needs to be addressed. A reduction in BMI by medical counseling, medication, dietary  or surgical means needs to be explored.    Given the high degree of Insomnia, I am not optimistic about compliance and effect of sleep apnea treatment translating into more and deeper sleep for this patient. Please refer for anxiety treatment to appropriate medical professional.    I will order an auto titration CPAP at 6 through 20 cm water pressure with 2 cm EPR.

## 2023-11-16 ENCOUNTER — Telehealth: Payer: Self-pay | Admitting: Neurology

## 2023-11-16 NOTE — Telephone Encounter (Signed)
 Called PT to schedule initial Cpap  Appt . (Between 12/03/23-02/01/24) No answer , LVM to call back

## 2023-11-30 NOTE — Telephone Encounter (Signed)
 Patient returned phone call to Initial CPAP. Informed patient appt available on 12/19/23 with Megan, NP. Patient refuse appt on 12/19/23. There was not any thing else available with neurologist, Nps, Informed patient would send a message to the nurse for her to see if she has anything.

## 2023-11-30 NOTE — Telephone Encounter (Signed)
 Pt cld back and was given an option of 01/17/24 at 8:15 AM w/NP Camilo Cella. Pt accepted appt.

## 2023-11-30 NOTE — Telephone Encounter (Signed)
 Called PT 3X No Answer  , lvm to call back

## 2023-11-30 NOTE — Telephone Encounter (Signed)
 Attempted to call Pt to schedule initial CPAP visit. No answer, LVM for call back.

## 2024-01-16 NOTE — Progress Notes (Unsigned)
 [facility] [address] [place] [place] [address] [phone]       OFFICE FOLLOW UP NOTE  Ms. AVIYANA SONNTAG Date of Birth:  1990-09-09 Medical Record Number:  [id number]    Primary neurologist: Dr. Chalice Reason for visit: Initial CPAP follow-up    SUBJECTIVE:   CHIEF COMPLAINT:  No chief complaint on file.   Follow-up visit:  Prior visit: 08/22/2023 with Dr. Chalice  Brief HPI:   Meredith [name] is a 33 y.o. female who was evaluated by Dr. Chalice in 08/2023 for concern of underlying sleep apnea with complaints of new onset nonrestorative sleep, insomnia, snoring, nocturia and morning headaches.  ESS 1/24. FSS 53/63.  In lab sleep study showed severe sleep apnea with total AHI of 50.2/h, was unable to proceed with split-night titration study due to prolonged sleep latency and insomnia, patient never took sleep aid which was previously provided.  She was started on AutoPap therapy although questioned ability for compliance due to high degree of insomnia.  Recommended follow-up with PCP for anxiety treatment.  Auto CPAP set up 10/2023.       Interval history:        ROS:   14 system review of systems performed and negative with exception of those listed in HPI  PMH:  Past Medical History:  Diagnosis Date   Hypoglycemia     PSH: No past surgical history on file.  Social History:  Social History   Socioeconomic History   Marital status: Single    Spouse name: Not on file   Number of children: Not on file   Years of education: Not on file   Highest education level: Master's degree (e.g., MA, MS, MEng, MEd, MSW, MBA)  Occupational History   Not on file  Tobacco Use   Smoking status: Never   Smokeless tobacco: Never  Vaping Use   Vaping status: Never Used  Substance and Sexual Activity   Alcohol use: No   Drug use: No   Sexual activity: Not on file  Other Topics Concern   Not on file  Social History Narrative   Not  on file   Social Drivers of Health   Financial Resource Strain: Low Risk  (07/29/2023)   Overall Financial Resource Strain (CARDIA)    Difficulty of Paying Living Expenses: Not very hard  Food Insecurity: No Food Insecurity (07/29/2023)   Hunger Vital Sign    Worried About Running Out of Food in the Last Year: Never true    Ran Out of Food in the Last Year: Never true  Transportation Needs: No Transportation Needs (07/29/2023)   PRAPARE - Administrator, Civil Service (Medical): No    Lack of Transportation (Non-Medical): No  Physical Activity: Insufficiently Active (07/29/2023)   Exercise Vital Sign    Days of Exercise per Week: 3 days    Minutes of Exercise per Session: 30 min  Stress: Stress Concern Present (07/29/2023)   [employer] of Occupational Health - Occupational Stress Questionnaire    Feeling of Stress : Very much  Social Connections: Socially Isolated (07/29/2023)   Social Connection and Isolation Panel    Frequency of Communication with Friends and Family: More than three times a week    Frequency of Social Gatherings with Friends and Family: More than three times a week    Attends Religious Services: Never    Database administrator or Organizations: No    Attends Banker Meetings: Not on  file    Marital Status: Never married  Catering manager Violence: Not on file    Family History:  Family History  Problem Relation Age of Onset   Hyperlipidemia Father     Medications:   Current Outpatient Medications on File Prior to Visit  Medication Sig Dispense Refill   ALPRAZolam  (XANAX ) 0.25 MG tablet Take 1 tablet (0.25 mg total) by mouth at bedtime as needed for anxiety or sleep (for use in the sleep lab.). 6 tablet 0   metoprolol  succinate (TOPROL -XL) 50 MG 24 hr tablet Take 1 tablet (50 mg total) by mouth daily. Take with or immediately following a meal. 90 tablet 3   traZODone  (DESYREL ) 50 MG tablet Take 0.5-1 tablets (25-50 mg total) by  mouth at bedtime as needed for sleep. 30 tablet 3   No current facility-administered medications on file prior to visit.    Allergies:   Allergies  Allergen Reactions   Penicillins Rash      OBJECTIVE:  Physical Exam  There were no vitals filed for this visit. There is no height or weight on file to calculate BMI. No results found.   General: well developed, well nourished, seated, in no evident distress Head: head normocephalic and atraumatic.   Neck: supple with no carotid or supraclavicular bruits Cardiovascular: regular rate and rhythm, no murmurs  Neurologic Exam Mental Status: Awake and fully alert. Oriented to place and time. Recent and remote memory intact. Attention span, concentration and fund of knowledge appropriate. Mood and affect appropriate.  Cranial Nerves: Pupils equal, briskly reactive to light. Extraocular movements full without nystagmus. Visual fields full to confrontation. Hearing intact. Facial sensation intact. Face, tongue, palate moves normally and symmetrically.  Motor: Normal bulk and tone. Normal strength in all tested extremity muscles Gait and Station: Arises from chair without difficulty. Stance is normal. Gait demonstrates normal stride length and balance without use of AD.         ASSESSMENT/PLAN: Meredith [name] is a 33 y.o. year old female    OSA on CPAP :  Compliance report shows satisfactory nightly usage and suboptimal >4hr usage, optimal residual AHI.   Continue current pressure settings 6-20 with EPR 2 Discussed continued nightly usage with ensuring greater than 4 hours nightly for optimal benefit and per insurance purposes.   Continue to follow with DME company for any needed supplies or CPAP related concerns CPAP set up 10/2023     Follow up in *** or call earlier if needed   CC:  PCP: Copland, Harlene BROCKS, MD    I personally spent a total of *** minutes in the care of the patient today including {Time Based  Coding:210964241}.     Harlene Bogaert, AGNP-BC  [facility] [address] [place], [place] [address]  Phone [phone] Fax [phone] Note: This document was prepared with digital dictation and possible smart phrase technology. Any transcriptional errors that result from this process are unintentional.

## 2024-01-17 ENCOUNTER — Encounter: Payer: Self-pay | Admitting: Adult Health

## 2024-01-17 ENCOUNTER — Ambulatory Visit (INDEPENDENT_AMBULATORY_CARE_PROVIDER_SITE_OTHER): Admitting: Adult Health

## 2024-01-17 VITALS — BP 138/88 | HR 102 | Ht 64.0 in | Wt 307.6 lb

## 2024-01-17 DIAGNOSIS — Z789 Other specified health status: Secondary | ICD-10-CM | POA: Diagnosis not present

## 2024-01-17 DIAGNOSIS — G4733 Obstructive sleep apnea (adult) (pediatric): Secondary | ICD-10-CM | POA: Diagnosis not present

## 2024-01-17 DIAGNOSIS — F458 Other somatoform disorders: Secondary | ICD-10-CM | POA: Diagnosis not present

## 2024-01-17 NOTE — Patient Instructions (Addendum)
 Your Plan:  Will adjust pressure settings to hopefully improve tolerance and reduce teeth grinding  Will also request you are seen by your DME [facility] to discuss ongoing issues with leaks - will also follow up with Dr. Chalice regarding other recommendations   You can also try lubricating eye drops or eye masks to help prevent dry eyes  Continue nightly use of CPAP with ensuring greater than 4 hours per night for optimal benefit  Please follow-up with your dentist regarding teeth grinding and possible benefit with oral appliance  Continue to follow with your DME for any needed supplies or CPAP related concerns  Consider discussing weight loss medications with your PCP such as Zepbound which is now approved for sleep apnea. Weight loss can significantly improve your sleep apnea where you may not need to use a CPAP in the future       Follow-up in 6-8 months or call earlier if needed      Thank you for coming to see us  at [facility]. I hope we have been able to provide you high quality care today.  You may receive a patient satisfaction survey over the next few weeks. We would appreciate your feedback and comments so that we may continue to improve ourselves and the health of our patients.

## 2024-01-18 NOTE — Progress Notes (Signed)
 Bridget [place] D, CMA  Zott, Gasper Ona, Tammy; Darrel Boyer New orders have been placed for the above pt, DOB: Apr 04, 1991 Thanks

## 2024-01-18 NOTE — Progress Notes (Signed)
 Agreee with assessment . CPAP follow up can be yearly . CD

## 2024-03-18 ENCOUNTER — Telehealth

## 2024-03-18 DIAGNOSIS — Z20822 Contact with and (suspected) exposure to covid-19: Secondary | ICD-10-CM | POA: Diagnosis not present

## 2024-03-19 MED ORDER — BENZONATATE 100 MG PO CAPS: 100.0000 mg | ORAL_CAPSULE | Freq: Three times a day (TID) | ORAL | 0 refills | Status: AC | PRN

## 2024-03-19 NOTE — Progress Notes (Signed)
 E-Visit for [employer] Virus Screening   Your current symptoms could be consistent with the coronavirus giving your known exposure and current symptoms. It is still recommended that you take an at-home COVID test for confirmation.  COVID-19 is a respiratory illness with symptoms that are similar to the flu. Symptoms are typically mild to moderate, but there have been cases of severe illness and death due to the virus. The following symptoms may appear 2-14 days after exposure: Fever Cough Shortness of breath or difficulty breathing Chills Repeated shaking with chills Muscle pain Headache Sore throat New loss of taste or smell Fatigue Congestion or runny nose Nausea or vomiting Diarrhea  It is vitally important that if you feel that you have an infection such as this virus or any other virus that you stay home and away from places where you may spread it to others.   You should quarantine until testing. If positive, you should quarantine until fever-free for 24 hours and symptoms improving. It is recommended to mask an additional 5 days after ending your quarantine.   You should wear a mask or cloth face covering over your nose and mouth if you must be around other people or animals, including pets (even at home). Try to stay at least 6 feet away from other people. This will protect the people around you.  I have prescribed Tessalon Perles 100 mg. You may take 1-2 capsules every 8 hours as needed for cough  You may also take acetaminophen (Tylenol) as needed for fever.   Reduce your risk of any infection by using the same precautions used for avoiding the common cold or flu:  Wash your hands often with soap and warm water for at least 20 seconds.  If soap and water are not readily available, use an alcohol-based hand sanitizer with at least 60% alcohol.  If coughing or sneezing, cover your mouth and nose by coughing or sneezing into the elbow areas of your shirt or coat, into a tissue or  into your sleeve (not your hands). Avoid shaking hands with others and consider head nods or verbal greetings only. Avoid touching your eyes, nose, or mouth with unwashed hands.  Avoid close contact with people who are sick. Avoid places or events with large numbers of people in one location, like concerts or sporting events. Carefully consider travel plans you have or are making. If you are planning any travel outside or inside the [place] , visit the CDC's Travelers' Health webpage for the latest health notices. If you have some symptoms but not all symptoms, continue to monitor at home and seek medical attention if your symptoms worsen. If you are having a medical emergency, call 911.  HOME CARE Only take medications as instructed by your medical team. Drink plenty of fluids and get plenty of rest. A steam or ultrasonic humidifier can help if you have congestion.   GET HELP RIGHT AWAY IF YOU HAVE EMERGENCY WARNING SIGNS** FOR COVID-19. If you or someone is showing any of these signs seek emergency medical care immediately. Call 911 or proceed to your closest emergency facility if: You develop worsening high fever. Trouble breathing Bluish lips or face Persistent pain or pressure in the chest New confusion Inability to wake or stay awake You cough up blood. Your symptoms become more severe  **This list is not all possible symptoms. Contact your medical provider for any symptoms that are sever or concerning to you.   MAKE SURE YOU  Understand these instructions. Will watch  your condition. Will get help right away if you are not doing well or get worse.  Your e-visit answers were reviewed by a board certified advanced clinical practitioner to complete your personal care plan.  Depending on the condition, your plan could have included both over the counter or prescription medications.  If there is a problem please reply once you have received a response from your provider.  Your safety is  important to us .  If you have drug allergies check your prescription carefully.    You can use MyChart to ask questions about today's visit, request a non-urgent call back, or ask for a work or school excuse for 24 hours related to this e-Visit. If it has been greater than 24 hours you will need to follow up with your provider, or enter a new e-Visit to address those concerns. You will get an e-mail in the next two days asking about your experience.  I hope that your e-visit has been valuable and will speed your recovery. Thank you for using e-visits.   I have spent 5 minutes in review of e-visit questionnaire, review and updating patient chart, medical decision making and response to patient.   Elsie Velma Lunger, PA-C

## 2024-05-19 NOTE — Patient Instructions (Incomplete)
 It was good to see you today, I will be in touch with your labs

## 2024-05-19 NOTE — Progress Notes (Unsigned)
  Healthcare at [facility] [address] [place], [place] [address] [phone]  Date:  05/20/2024   Name:  Meredith [name]   DOB:  10-Oct-1990   MRN:  [id number]  PCP:  Watt Harlene BROCKS, MD    Chief Complaint: No chief complaint on file.   History of Present Illness:  Meredith [name] is a 33 y.o. very pleasant female patient who presents with the following:  Patient today with concern of feeling tired and lethargic.  History of sleep apnea, significant obesity, hypertension, prediabetes I saw her most recently in [place] that time she was concerned about difficulty sleeping with frequent awakening She had a sleep study which showed severe sleep apnea, this severely affected her total sleep and sleep efficiency.  RECOMMENDATIONS:  I recommend to start PAP treatment for this patient by home titration.  This study shows limited sleep but hypopneas were seen , not apneas. This is a classic finding in Obesity hypoventilation.  In addition, weight loss needs to be addressed. A reduction in BMI by medical counseling, medication, dietary  or surgical means needs to be explored.    -She was started on CPAP.  However most recent neurology note from August indicates some difficulty using her CPAP machine   Tetanus booster Flu shot Can offer blood work, STI screening Pap smear Most recent labs on chart from June 2024  Discussed the use of AI scribe software for clinical note transcription with the patient, who gave verbal consent to proceed.  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  Social History   Tobacco Use   Smoking status: Never   Smokeless tobacco: Never  Vaping Use   Vaping status: Never Used  Substance Use Topics   Alcohol use: No   Drug use: No    Family History  Problem Relation Age of Onset   Hyperlipidemia Father     Allergies  Allergen Reactions   Penicillins Rash    Medication list has been reviewed and updated.  Current Outpatient Medications on File Prior to Visit  Medication Sig Dispense Refill   Ashwagandha 500 MG CAPS Take 500 mg by mouth at bedtime.     benzonatate  (TESSALON ) 100 MG capsule Take 1 capsule (100 mg total) by mouth 3 (three) times daily as needed for cough. 30 capsule 0   Magnesium Gluconate 250 MG TABS Take by mouth.     metoprolol  succinate (TOPROL -XL) 50 MG 24 hr tablet Take 1 tablet (50 mg total) by mouth daily. Take with or immediately following a meal. 90 tablet 3   traZODone  (DESYREL ) 50 MG tablet Take 0.5-1 tablets (25-50 mg total) by mouth at bedtime as needed for sleep. (Patient not taking: Reported on 01/17/2024) 30 tablet 3   No current facility-administered medications on file prior to visit.    Review of Systems:  As per HPI- otherwise negative.   Physical Examination: There were no vitals filed for this visit. There were no vitals filed for this visit.  There is no height or weight on file to calculate BMI. Ideal Body Weight:    GEN: no acute distress. HEENT: Atraumatic, Normocephalic.  Ears and Nose: No external deformity. CV: RRR, No M/G/R. No JVD. No thrill. No extra heart sounds. PULM: CTA B, no wheezes, crackles, rhonchi. No retractions. No resp. distress. No accessory muscle use. ABD: S, NT, ND, +BS. No rebound. No HSM. EXTR: No c/c/e PSYCH: Normally interactive. Conversant.    Assessment and Plan: No diagnosis found.  Assessment & Plan   Signed Harlene Schroeder, MD

## 2024-05-20 ENCOUNTER — Encounter: Payer: Self-pay | Admitting: Family Medicine

## 2024-05-20 ENCOUNTER — Ambulatory Visit: Admitting: Family Medicine

## 2024-05-20 VITALS — BP 140/90 | HR 87 | Ht 64.0 in | Wt 319.8 lb

## 2024-05-20 DIAGNOSIS — F5103 Paradoxical insomnia: Secondary | ICD-10-CM

## 2024-05-20 DIAGNOSIS — Z6841 Body Mass Index (BMI) 40.0 and over, adult: Secondary | ICD-10-CM | POA: Diagnosis not present

## 2024-05-20 DIAGNOSIS — I10 Essential (primary) hypertension: Secondary | ICD-10-CM | POA: Diagnosis not present

## 2024-05-20 DIAGNOSIS — Z23 Encounter for immunization: Secondary | ICD-10-CM | POA: Diagnosis not present

## 2024-05-20 DIAGNOSIS — Z13220 Encounter for screening for lipoid disorders: Secondary | ICD-10-CM | POA: Diagnosis not present

## 2024-05-20 DIAGNOSIS — Z131 Encounter for screening for diabetes mellitus: Secondary | ICD-10-CM

## 2024-05-20 DIAGNOSIS — E662 Morbid (severe) obesity with alveolar hypoventilation: Secondary | ICD-10-CM | POA: Diagnosis not present

## 2024-05-20 DIAGNOSIS — R5383 Other fatigue: Secondary | ICD-10-CM

## 2024-05-20 DIAGNOSIS — R350 Frequency of micturition: Secondary | ICD-10-CM

## 2024-05-20 DIAGNOSIS — Z1329 Encounter for screening for other suspected endocrine disorder: Secondary | ICD-10-CM

## 2024-05-20 LAB — LIPID PANEL
Cholesterol: 169 mg/dL (ref 0–200)
HDL: 43.9 mg/dL (ref >39.00)
LDL Cholesterol: 101 mg/dL — ABNORMAL HIGH (ref 0–99)
NonHDL: 125.33
Total CHOL/HDL Ratio: 4
Triglycerides: 123 mg/dL (ref 0.0–149.0)
VLDL: 24.6 mg/dL (ref 0.0–40.0)

## 2024-05-20 LAB — VITAMIN B12: Vitamin B-12: 603 pg/mL (ref 211–911)

## 2024-05-20 LAB — CBC
HCT: 40.5 % (ref 36.0–46.0)
Hemoglobin: 13.4 g/dL (ref 12.0–15.0)
MCHC: 33 g/dL (ref 30.0–36.0)
MCV: 86.1 fl (ref 78.0–100.0)
Platelets: 278 K/uL (ref 150.0–400.0)
RBC: 4.7 Mil/uL (ref 3.87–5.11)
RDW: 15.2 % (ref 11.5–15.5)
WBC: 9.1 K/uL (ref 4.0–10.5)

## 2024-05-20 LAB — POCT URINALYSIS DIP (MANUAL ENTRY)
Bilirubin, UA: NEGATIVE
Blood, UA: NEGATIVE
Glucose, UA: NEGATIVE mg/dL
Ketones, POC UA: NEGATIVE mg/dL
Nitrite, UA: NEGATIVE
Protein Ur, POC: NEGATIVE mg/dL
Spec Grav, UA: <=1.005 — AB (ref 1.010–1.025)
Urobilinogen, UA: 0.2 U/dL
pH, UA: 8 (ref 5.0–8.0)

## 2024-05-20 LAB — COMPREHENSIVE METABOLIC PANEL WITH GFR
ALT: 14 U/L (ref 0–35)
AST: 12 U/L (ref 0–37)
Albumin: 4.4 g/dL (ref 3.5–5.2)
Alkaline Phosphatase: 61 U/L (ref 39–117)
BUN: 12 mg/dL (ref 6–23)
CO2: 30 meq/L (ref 19–32)
Calcium: 9.4 mg/dL (ref 8.4–10.5)
Chloride: 100 meq/L (ref 96–112)
Creatinine, Ser: 0.54 mg/dL (ref 0.40–1.20)
GFR: 121.49 mL/min (ref >60.00)
Glucose, Bld: 95 mg/dL (ref 70–99)
Potassium: 4.1 meq/L (ref 3.5–5.1)
Sodium: 140 meq/L (ref 135–145)
Total Bilirubin: 0.4 mg/dL (ref 0.2–1.2)
Total Protein: 7.3 g/dL (ref 6.0–8.3)

## 2024-05-20 LAB — TSH: TSH: 1.06 u[IU]/mL (ref 0.35–5.50)

## 2024-05-20 LAB — POCT URINE PREGNANCY: Preg Test, Ur: NEGATIVE

## 2024-05-20 LAB — HEMOGLOBIN A1C: Hgb A1c MFr Bld: 5.4 % (ref 4.6–6.5)

## 2024-05-20 MED ORDER — TIRZEPATIDE 2.5 MG/0.5ML ~~LOC~~ SOAJ: 2.5000 mg | SUBCUTANEOUS | Status: DC

## 2024-05-21 LAB — URINE CULTURE
MICRO NUMBER:: 17325935
Result:: NO GROWTH
SPECIMEN QUALITY:: ADEQUATE

## 2024-05-22 ENCOUNTER — Ambulatory Visit: Payer: Self-pay | Admitting: Family Medicine

## 2024-05-29 ENCOUNTER — Other Ambulatory Visit: Payer: Self-pay | Admitting: Family Medicine

## 2024-05-29 ENCOUNTER — Encounter: Payer: Self-pay | Admitting: Family Medicine

## 2024-05-29 ENCOUNTER — Telehealth: Payer: Self-pay

## 2024-05-29 ENCOUNTER — Other Ambulatory Visit (HOSPITAL_COMMUNITY): Payer: Self-pay

## 2024-05-29 DIAGNOSIS — E662 Morbid (severe) obesity with alveolar hypoventilation: Secondary | ICD-10-CM

## 2024-05-29 MED ORDER — ZEPBOUND 2.5 MG/0.5ML ~~LOC~~ SOAJ: 2.5000 mg | SUBCUTANEOUS | 1 refills | Status: AC

## 2024-05-29 NOTE — Telephone Encounter (Signed)
 Pharmacy Patient Advocate Encounter   Received notification from Physician's Office that prior authorization for Zepbound  2.5 mg/0.5 ml is required/requested.   Insurance verification completed.   The patient is insured through [employer].   Per test claim: Per test claim, medication is not covered due to plan/benefit exclusion, PA not submitted at this time

## 2024-05-30 MED ORDER — TIRZEPATIDE-WEIGHT MANAGEMENT 2.5 MG/0.5ML ~~LOC~~ SOLN: 2.5000 mg | SUBCUTANEOUS | 2 refills | Status: AC

## 2024-05-30 NOTE — Addendum Note (Signed)
 Addended by: WATT RAISIN C on: 05/30/2024 10:14 AM   Modules accepted: Orders

## 2024-06-23 MED ORDER — PANTOPRAZOLE SODIUM 40 MG PO TBEC: 40.0000 mg | DELAYED_RELEASE_TABLET | Freq: Every day | ORAL | 1 refills | Status: AC

## 2024-06-23 NOTE — Addendum Note (Signed)
 Addended by: WATT RAISIN C on: 06/23/2024 07:15 AM   Modules accepted: Orders

## 2024-07-15 ENCOUNTER — Emergency Department (HOSPITAL_BASED_OUTPATIENT_CLINIC_OR_DEPARTMENT_OTHER): Admission: EM | Admit: 2024-07-15 | Discharge: 2024-07-15 | Disposition: A | Discharge status: Home / Self Care

## 2024-07-15 ENCOUNTER — Encounter (HOSPITAL_BASED_OUTPATIENT_CLINIC_OR_DEPARTMENT_OTHER): Payer: Self-pay

## 2024-07-15 ENCOUNTER — Other Ambulatory Visit: Payer: Self-pay

## 2024-07-15 DIAGNOSIS — M7918 Myalgia, other site: Secondary | ICD-10-CM | POA: Insufficient documentation

## 2024-07-15 DIAGNOSIS — M791 Myalgia, unspecified site: Secondary | ICD-10-CM

## 2024-07-15 LAB — COMPREHENSIVE METABOLIC PANEL WITH GFR
ALT: 12 U/L (ref 0–44)
AST: 12 U/L — ABNORMAL LOW (ref 15–41)
Albumin: 4.4 g/dL (ref 3.5–5.0)
Alkaline Phosphatase: 60 U/L (ref 38–126)
Anion gap: 15 (ref 5–15)
BUN: 15 mg/dL (ref 6–20)
CO2: 22 mmol/L (ref 22–32)
Calcium: 9.7 mg/dL (ref 8.9–10.3)
Chloride: 101 mmol/L (ref 98–111)
Creatinine, Ser: 0.62 mg/dL (ref 0.44–1.00)
GFR, Estimated: >60 mL/min
Glucose, Bld: 114 mg/dL — ABNORMAL HIGH (ref 70–99)
Potassium: 3.7 mmol/L (ref 3.5–5.1)
Sodium: 139 mmol/L (ref 135–145)
Total Bilirubin: 0.2 mg/dL (ref 0.0–1.2)
Total Protein: 7.9 g/dL (ref 6.5–8.1)

## 2024-07-15 LAB — RESP PANEL BY RT-PCR (RSV, FLU A&B, COVID)  RVPGX2
Influenza A by PCR: NEGATIVE
Influenza B by PCR: NEGATIVE
Resp Syncytial Virus by PCR: NEGATIVE
SARS Coronavirus 2 by RT PCR: NEGATIVE

## 2024-07-15 LAB — CBC WITH DIFFERENTIAL/PLATELET
Abs Immature Granulocytes: 0.12 10*3/uL — ABNORMAL HIGH (ref 0.00–0.07)
Basophils Absolute: 0 10*3/uL (ref 0.0–0.1)
Basophils Relative: 0 %
Eosinophils Absolute: 0 10*3/uL (ref 0.0–0.5)
Eosinophils Relative: 0 %
HCT: 42.3 % (ref 36.0–46.0)
Hemoglobin: 13.9 g/dL (ref 12.0–15.0)
Immature Granulocytes: 1 %
Lymphocytes Relative: 13 %
Lymphs Abs: 1.9 10*3/uL (ref 0.7–4.0)
MCH: 27.9 pg (ref 26.0–34.0)
MCHC: 32.9 g/dL (ref 30.0–36.0)
MCV: 84.9 fL (ref 80.0–100.0)
Monocytes Absolute: 0.5 10*3/uL (ref 0.1–1.0)
Monocytes Relative: 3 %
Neutro Abs: 12.7 10*3/uL — ABNORMAL HIGH (ref 1.7–7.7)
Neutrophils Relative %: 83 %
Platelets: 302 10*3/uL (ref 150–400)
RBC: 4.98 MIL/uL (ref 3.87–5.11)
RDW: 13.7 % (ref 11.5–15.5)
WBC: 15.3 10*3/uL — ABNORMAL HIGH (ref 4.0–10.5)
nRBC: 0 % (ref 0.0–0.2)

## 2024-07-15 LAB — MAGNESIUM: Magnesium: 2.1 mg/dL (ref 1.7–2.4)

## 2024-07-15 LAB — GROUP A STREP BY PCR: Group A Strep by PCR: NOT DETECTED

## 2024-07-15 LAB — HCG, SERUM, QUALITATIVE: Preg, Serum: NEGATIVE

## 2024-07-15 LAB — CK: Total CK: 26 U/L — ABNORMAL LOW (ref 38–234)

## 2024-07-15 MED ORDER — LACTATED RINGERS IV BOLUS
1000.0000 mL | Freq: Once | INTRAVENOUS | Status: AC
  Administered 2024-07-15: 1000 mL via INTRAVENOUS

## 2024-07-15 MED ORDER — KETOROLAC TROMETHAMINE 15 MG/ML IJ SOLN
15.0000 mg | Freq: Once | INTRAMUSCULAR | Status: AC
  Administered 2024-07-15: 15 mg via INTRAVENOUS
  Filled 2024-07-15: qty 1

## 2024-07-15 MED ORDER — PREDNISONE 10 MG (21) PO TBPK: ORAL_TABLET | Freq: Every day | ORAL | 0 refills | Status: AC

## 2024-07-15 MED ORDER — METHYLPREDNISOLONE SODIUM SUCC 125 MG IJ SOLR
125.0000 mg | Freq: Once | INTRAMUSCULAR | Status: AC
  Administered 2024-07-15: 125 mg via INTRAVENOUS
  Filled 2024-07-15: qty 2

## 2024-07-15 NOTE — ED Notes (Signed)
 Discharge instructions reviewed with patient. Patient verbalizes understanding, no further questions at this time. Medications/prescriptions and follow up information provided. No acute distress noted at time of departure.

## 2024-07-15 NOTE — ED Triage Notes (Signed)
 Reports hives for 5 days, states hives have resolved since steroid shot from UC  Also reports BIL forearm and hand pain for 4 days. States feels like pain is deep in the bone   Also reports sore throat since this morning. Brother currently sick

## 2024-07-15 NOTE — Discharge Instructions (Addendum)
 Please follow-up with your primary doctor.  We did refer you to rheumatology.  They should call you to schedule an appointment.  Take the steroids that we are prescribing to you.  He may also take Tylenol alternate with ibuprofen for further pain relief.  Return for fevers, chills, severe headache, vision loss, facial droop, rash returns, your arms become blue, cold, pale or conversely, red hot or swollen or any new or worsening symptoms that are concerning to you.

## 2024-07-17 ENCOUNTER — Encounter: Payer: Self-pay | Admitting: Family Medicine

## 2024-07-18 ENCOUNTER — Encounter: Payer: Self-pay | Admitting: Family Medicine

## 2024-07-18 ENCOUNTER — Ambulatory Visit: Admitting: Family Medicine

## 2024-07-18 VITALS — BP 144/100 | HR 93 | Temp 97.8°F | Resp 18 | Ht 64.0 in | Wt 306.8 lb

## 2024-07-18 DIAGNOSIS — I10 Essential (primary) hypertension: Secondary | ICD-10-CM

## 2024-07-18 DIAGNOSIS — M2550 Pain in unspecified joint: Secondary | ICD-10-CM

## 2024-07-18 LAB — CBC
HCT: 42.8 % (ref 36.0–46.0)
Hemoglobin: 13.8 g/dL (ref 12.0–15.0)
MCHC: 32.4 g/dL (ref 30.0–36.0)
MCV: 84.5 fl (ref 78.0–100.0)
Platelets: 290 10*3/uL (ref 150.0–400.0)
RBC: 5.06 Mil/uL (ref 3.87–5.11)
RDW: 14.7 % (ref 11.5–15.5)
WBC: 14.9 10*3/uL — ABNORMAL HIGH (ref 4.0–10.5)

## 2024-07-18 LAB — C-REACTIVE PROTEIN: CRP: 0.6 mg/dL — ABNORMAL LOW (ref 1.0–20.0)

## 2024-07-18 LAB — URIC ACID: Uric Acid, Serum: 5.8 mg/dL (ref 2.4–7.0)

## 2024-07-18 LAB — SEDIMENTATION RATE: Sed Rate: 21 mm/h — ABNORMAL HIGH (ref 0–20)

## 2024-07-18 NOTE — Progress Notes (Signed)
 [employer], [employer] [facility] [employer] [address] [place], [place] [address] [phone]  Date:  07/18/2024   Name:  Meredith [name]   DOB:  13-Jan-1991   MRN:  [id number]  PCP:  Watt Harlene BROCKS, MD    Chief Complaint: UC follow up   History of Present Illness:  Meredith [name] is a 34 y.o. very pleasant female patient who presents with the following:  Patient seen today with concern of various body pains-I saw her most recently in December History of sleep apnea, significant obesity, hypertension, prediabetes  Today is Thursday   She was seen in the emergency room on February 2-  This is a 34 year old female presenting emergency department with myalgia to her bilateral upper extremities. Reports she had a rash last week that appeared to be hives over torso or extremities. It was itchy. Went to urgent care and received some steroids. With improvement of the rash. However having myalgia to bilateral upper extremities. Painful to move. No weakness no fevers or chills no chest pain no shortness of breath. She noted she had a little bit of a sore throat this morning, but has resolved.  It looks like her exam was relatively benign, she was given steroids, Toradol , fluids and released to home.  Referral was made to rheumatology  She is doing a prednisone  taper - 10 mg Take by mouth daily. Take 6 tabs by mouth daily for 2 days, then 5 tabs for 2 days, then 4 tabs for 2 days, then 3 tabs for 2 days, 2 tabs for 2 days, then 1 tab by mouth daily for 2 days   Discussed the use of AI scribe software for clinical note transcription with the patient, who gave verbal consent to proceed.  History of Present Illness Meredith [name] is a 34 year old female who presents with generalized pain and a history of recent hives.  She has been experiencing generalized pain, primarily in her arms, since Friday night. The pain is described as a dull ache in her wrists  and forearms, exacerbated by pressure or movement, such as lifting or walking dogs. The pain intensity varies, reaching severe levels at times, particularly in the morning. She has been taking prednisone , initially prescribed at urgent care and later increased at the emergency department, along with Toradol  administered via IV. Despite these treatments, she continues to experience significant pain, which has impacted her ability to work.  She developed hives on Thursday morning last week, which lasted a few hours and were partially relieved by Claritin. The rash has since resolved with steroid treatment, although she continues to experience some itching, particularly in her underarms. The rash was initially present in body creases, on her face, and as hives on her fingers.  She reports frequent urination with a burning sensation and an itchy throat, which she associates with steroid use. No fever, although she feels generally unwell and run down. She has been tested for strep, flu, and COVID, all of which were negative.  Her past medical history includes a naturally high white cell count. She is currently on a 50 mg dose of prednisone  and has been alternating between ibuprofen and Tylenol for pain management. She is also on metoprolol , which she has been taking at a half dose but plans to increase to a full dose as previously prescribed.  There is no known family history of autoimmune disorders, although her father's side of the  family history is unclear. She has not experienced similar symptoms in the past and is concerned about the cause of her current condition.  She was out of work this past Monday, worked Tuesday, out yesterday, today and tomorrow  Try to RTW on Monday 07/22/24  Patient Active Problem List   Diagnosis Date Noted   Uncontrolled morning headache 08/22/2023   Snoring 08/22/2023   At risk for obstructive sleep apnea 08/22/2023   Extreme obesity with alveolar hypoventilation (HCC)  08/22/2023   Essential hypertension 07/27/2023   Prediabetes 05/06/2020   General medical examination 12/22/2010   LOM 01/26/2010    Past Medical History:  Diagnosis Date   Hypoglycemia     No past surgical history on file.  Social History[1]  Family History  Problem Relation Age of Onset   Hyperlipidemia Father     Allergies[2]  Medication list has been reviewed and updated.  Medications Ordered Prior to Encounter[3]  Review of Systems:  As per HPI- otherwise negative.   Physical Examination: Vitals:   07/18/24 1005  BP: (!) 144/100  Pulse: 93  Resp: 18  Temp: 97.8 F (36.6 C)  SpO2: 98%   Vitals:   07/18/24 1005  Weight: (!) 306 lb 12.8 oz (139.2 kg)  Height: 5' 4 (1.626 m)   Body mass index is 52.66 kg/m. Ideal Body Weight: Weight in (lb) to have BMI = 25: 145.3 125/90 on recheck  GEN: no acute distress.  Obese, looks well  HEENT: Atraumatic, Normocephalic.  Bilateral TM wnl, oropharynx normal.  PEERL,EOMI.   Ears and Nose: No external deformity. CV: RRR, No M/G/R. No JVD. No thrill. No extra heart sounds. PULM: CTA B, no wheezes, crackles, rhonchi. No retractions. No resp. distress. No accessory muscle use. EXTR: No c/c/e PSYCH: Normally interactive. Conversant.  At this time her elbows, wrists, hands all seem normal except for slightly decreased grip strength which is equal bilaterally   BP Readings from Last 3 Encounters:  07/18/24 (!) 144/100  07/15/24 (!) 179/113  05/20/24 (!) 140/90   Wt Readings from Last 3 Encounters:  07/18/24 (!) 306 lb 12.8 oz (139.2 kg)  05/20/24 (!) 319 lb 12.8 oz (145.1 kg)  01/17/24 (!) 307 lb 9.6 oz (139.5 kg)     Assessment and Plan: Acute joint pain - Plan: C-reactive protein, Sedimentation rate, Antinuclear Antib (ANA), Rheumatoid Factor, Uric acid, Cyclic citrul peptide antibody, IgG (QUEST), CBC  Hypertension, unspecified type  Assessment & Plan Acute joint pain with urticaria, under autoimmune  evaluation Severe joint pain in arms with urticaria, improved with steroids. Autoimmune disorder suspected, pending evaluation. Elevated white cell count likely due to steroids. Negative for strep, flu, and COVID. Awaiting rheumatology consult. - Ordered autoimmune labs. - Repeated blood count to monitor white cell count. - Checked uric acid level to rule out gout. - Continue alternating ibuprofen and acetaminophen for pain, prefer acetaminophen. - Hold Zepbound  injection for now while we wait and see if she continues to improve - Encouraged rheumatology follow-up.  Essential hypertension Blood pressure controlled with metoprolol . Advised to return to full dose. - Increased metoprolol  to full 50 mg dose. - Encouraged obtaining home blood pressure monitor.    Signed Harlene Schroeder, MD Received her labs so far and sent message to patient.  I also completed her FMLA paperwork Results for orders placed or performed in visit on 07/18/24  C-reactive protein   Collection Time: 07/18/24 10:31 AM  Result Value Ref Range   CRP 0.6 (L) 1.0 -  20.0 mg/dL  Sedimentation rate   Collection Time: 07/18/24 10:31 AM  Result Value Ref Range   Sed Rate 21 (H) 0 - 20 mm/hr  Uric acid   Collection Time: 07/18/24 10:31 AM  Result Value Ref Range   Uric Acid, Serum 5.8 2.4 - 7.0 mg/dL  CBC   Collection Time: 07/18/24 10:31 AM  Result Value Ref Range   WBC 14.9 (H) 4.0 - 10.5 K/uL   RBC 5.06 3.87 - 5.11 Mil/uL   Platelets 290.0 150.0 - 400.0 K/uL   Hemoglobin 13.8 12.0 - 15.0 g/dL   HCT 57.1 63.9 - 53.9 %   MCV 84.5 78.0 - 100.0 fl   MCHC 32.4 30.0 - 36.0 g/dL   RDW 85.2 88.4 - 84.4 %        [1]  Social History Tobacco Use   Smoking status: Never   Smokeless tobacco: Never  Vaping Use   Vaping status: Never Used  Substance Use Topics   Alcohol use: No   Drug use: No  [2]  Allergies Allergen Reactions   Penicillins Rash  [3]  Current Outpatient Medications on File Prior to Visit   Medication Sig Dispense Refill   Ashwagandha 500 MG CAPS Take 500 mg by mouth at bedtime.     Magnesium Gluconate 250 MG TABS Take by mouth.     metoprolol  succinate (TOPROL -XL) 50 MG 24 hr tablet Take 1 tablet (50 mg total) by mouth daily. Take with or immediately following a meal. 90 tablet 3   pantoprazole  (PROTONIX ) 40 MG tablet Take 1 tablet (40 mg total) by mouth daily. 90 tablet 1   predniSONE  (STERAPRED UNI-PAK 21 TAB) 10 MG (21) TBPK tablet Take by mouth daily. Take 6 tabs by mouth daily  for 2 days, then 5 tabs for 2 days, then 4 tabs for 2 days, then 3 tabs for 2 days, 2 tabs for 2 days, then 1 tab by mouth daily for 2 days 42 tablet 0   tirzepatide  (ZEPBOUND ) 2.5 MG/0.5ML injection vial Inject 2.5 mg into the skin once a week. 2 mL 2   tirzepatide  (ZEPBOUND ) 2.5 MG/0.5ML Pen Inject 2.5 mg into the skin once a week. 2 mL 1   benzonatate  (TESSALON ) 100 MG capsule Take 1 capsule (100 mg total) by mouth 3 (three) times daily as needed for cough. (Patient not taking: Reported on 07/18/2024) 30 capsule 0   No current facility-administered medications on file prior to visit.   "

## 2024-07-19 LAB — RHEUMATOID FACTOR: Rheumatoid fact SerPl-aCnc: <10 [IU]/mL

## 2024-07-19 LAB — ANA: Anti Nuclear Antibody (ANA): NEGATIVE

## 2024-08-22 ENCOUNTER — Telehealth: Admitting: Adult Health

## 2024-09-10 ENCOUNTER — Ambulatory Visit
